# Patient Record
Sex: Female | Born: 1955
Health system: Southern US, Community
[De-identification: ages and names within clinical notes are randomized; demographics above are authoritative.]

## PROBLEM LIST (undated history)

## (undated) DIAGNOSIS — C801 Malignant (primary) neoplasm, unspecified: Secondary | ICD-10-CM

## (undated) HISTORY — PX: ABDOMINAL SURGERY: SHX537

## (undated) HISTORY — PX: TONSILLECTOMY: SUR1361

---

## 2018-01-08 DIAGNOSIS — R300 Dysuria: Secondary | ICD-10-CM | POA: Diagnosis not present

## 2018-01-29 DIAGNOSIS — R599 Enlarged lymph nodes, unspecified: Secondary | ICD-10-CM | POA: Diagnosis not present

## 2018-01-29 DIAGNOSIS — R8289 Other abnormal findings on cytological and histological examination of urine: Secondary | ICD-10-CM | POA: Diagnosis not present

## 2018-01-29 DIAGNOSIS — R3129 Other microscopic hematuria: Secondary | ICD-10-CM | POA: Diagnosis not present

## 2018-01-29 DIAGNOSIS — R590 Localized enlarged lymph nodes: Secondary | ICD-10-CM | POA: Diagnosis not present

## 2018-01-29 DIAGNOSIS — R339 Retention of urine, unspecified: Secondary | ICD-10-CM | POA: Diagnosis not present

## 2018-01-30 DIAGNOSIS — C7951 Secondary malignant neoplasm of bone: Secondary | ICD-10-CM | POA: Diagnosis not present

## 2018-01-30 DIAGNOSIS — R935 Abnormal findings on diagnostic imaging of other abdominal regions, including retroperitoneum: Secondary | ICD-10-CM | POA: Diagnosis not present

## 2018-01-30 DIAGNOSIS — C539 Malignant neoplasm of cervix uteri, unspecified: Secondary | ICD-10-CM | POA: Diagnosis not present

## 2018-02-03 DIAGNOSIS — R935 Abnormal findings on diagnostic imaging of other abdominal regions, including retroperitoneum: Secondary | ICD-10-CM | POA: Diagnosis not present

## 2018-02-03 DIAGNOSIS — R1900 Intra-abdominal and pelvic swelling, mass and lump, unspecified site: Secondary | ICD-10-CM | POA: Diagnosis not present

## 2018-02-03 DIAGNOSIS — C52 Malignant neoplasm of vagina: Secondary | ICD-10-CM | POA: Diagnosis not present

## 2018-02-03 DIAGNOSIS — N898 Other specified noninflammatory disorders of vagina: Secondary | ICD-10-CM | POA: Diagnosis not present

## 2018-02-04 DIAGNOSIS — R1900 Intra-abdominal and pelvic swelling, mass and lump, unspecified site: Secondary | ICD-10-CM | POA: Diagnosis not present

## 2018-02-04 DIAGNOSIS — C679 Malignant neoplasm of bladder, unspecified: Secondary | ICD-10-CM | POA: Diagnosis not present

## 2018-02-10 DIAGNOSIS — R918 Other nonspecific abnormal finding of lung field: Secondary | ICD-10-CM | POA: Diagnosis not present

## 2018-02-10 DIAGNOSIS — N200 Calculus of kidney: Secondary | ICD-10-CM | POA: Diagnosis not present

## 2018-02-10 DIAGNOSIS — C679 Malignant neoplasm of bladder, unspecified: Secondary | ICD-10-CM | POA: Diagnosis not present

## 2018-02-10 DIAGNOSIS — R1900 Intra-abdominal and pelvic swelling, mass and lump, unspecified site: Secondary | ICD-10-CM | POA: Diagnosis not present

## 2018-02-12 DIAGNOSIS — C539 Malignant neoplasm of cervix uteri, unspecified: Secondary | ICD-10-CM | POA: Diagnosis not present

## 2018-02-13 DIAGNOSIS — C539 Malignant neoplasm of cervix uteri, unspecified: Secondary | ICD-10-CM | POA: Diagnosis not present

## 2018-02-14 DIAGNOSIS — C539 Malignant neoplasm of cervix uteri, unspecified: Secondary | ICD-10-CM | POA: Diagnosis not present

## 2018-02-17 DIAGNOSIS — C539 Malignant neoplasm of cervix uteri, unspecified: Secondary | ICD-10-CM | POA: Diagnosis not present

## 2018-02-18 DIAGNOSIS — C539 Malignant neoplasm of cervix uteri, unspecified: Secondary | ICD-10-CM | POA: Diagnosis not present

## 2018-02-19 DIAGNOSIS — C539 Malignant neoplasm of cervix uteri, unspecified: Secondary | ICD-10-CM | POA: Diagnosis not present

## 2018-02-20 DIAGNOSIS — G4701 Insomnia due to medical condition: Secondary | ICD-10-CM | POA: Diagnosis not present

## 2018-02-20 DIAGNOSIS — R1900 Intra-abdominal and pelvic swelling, mass and lump, unspecified site: Secondary | ICD-10-CM | POA: Diagnosis not present

## 2018-02-20 DIAGNOSIS — Z515 Encounter for palliative care: Secondary | ICD-10-CM | POA: Diagnosis not present

## 2018-02-20 DIAGNOSIS — C539 Malignant neoplasm of cervix uteri, unspecified: Secondary | ICD-10-CM | POA: Diagnosis not present

## 2018-02-24 DIAGNOSIS — C539 Malignant neoplasm of cervix uteri, unspecified: Secondary | ICD-10-CM | POA: Diagnosis not present

## 2018-02-25 DIAGNOSIS — C539 Malignant neoplasm of cervix uteri, unspecified: Secondary | ICD-10-CM | POA: Diagnosis not present

## 2018-02-26 DIAGNOSIS — C539 Malignant neoplasm of cervix uteri, unspecified: Secondary | ICD-10-CM | POA: Diagnosis not present

## 2018-02-27 DIAGNOSIS — C539 Malignant neoplasm of cervix uteri, unspecified: Secondary | ICD-10-CM | POA: Diagnosis not present

## 2018-02-28 DIAGNOSIS — C539 Malignant neoplasm of cervix uteri, unspecified: Secondary | ICD-10-CM | POA: Diagnosis not present

## 2018-03-03 DIAGNOSIS — Z5111 Encounter for antineoplastic chemotherapy: Secondary | ICD-10-CM | POA: Diagnosis not present

## 2018-03-03 DIAGNOSIS — Z5112 Encounter for antineoplastic immunotherapy: Secondary | ICD-10-CM | POA: Diagnosis not present

## 2018-03-03 DIAGNOSIS — C538 Malignant neoplasm of overlapping sites of cervix uteri: Secondary | ICD-10-CM | POA: Diagnosis not present

## 2018-03-03 DIAGNOSIS — Z923 Personal history of irradiation: Secondary | ICD-10-CM | POA: Diagnosis not present

## 2018-03-03 DIAGNOSIS — Z87891 Personal history of nicotine dependence: Secondary | ICD-10-CM | POA: Diagnosis not present

## 2018-03-03 DIAGNOSIS — C7951 Secondary malignant neoplasm of bone: Secondary | ICD-10-CM | POA: Diagnosis not present

## 2018-03-03 DIAGNOSIS — Z7189 Other specified counseling: Secondary | ICD-10-CM | POA: Diagnosis not present

## 2018-03-03 DIAGNOSIS — R590 Localized enlarged lymph nodes: Secondary | ICD-10-CM | POA: Diagnosis not present

## 2018-03-03 DIAGNOSIS — K5900 Constipation, unspecified: Secondary | ICD-10-CM | POA: Diagnosis not present

## 2018-03-03 DIAGNOSIS — C539 Malignant neoplasm of cervix uteri, unspecified: Secondary | ICD-10-CM | POA: Diagnosis not present

## 2018-03-03 DIAGNOSIS — K6289 Other specified diseases of anus and rectum: Secondary | ICD-10-CM | POA: Diagnosis not present

## 2018-03-10 ENCOUNTER — Inpatient Hospital Stay (HOSPITAL_BASED_OUTPATIENT_CLINIC_OR_DEPARTMENT_OTHER)
Admission: EM | Admit: 2018-03-10 | Discharge: 2018-03-14 | DRG: 872 | Disposition: A | Discharge status: Home / Self Care | Payer: BLUE CROSS/BLUE SHIELD | Attending: Internal Medicine | Admitting: Internal Medicine

## 2018-03-10 ENCOUNTER — Other Ambulatory Visit: Payer: Self-pay

## 2018-03-10 ENCOUNTER — Encounter (HOSPITAL_BASED_OUTPATIENT_CLINIC_OR_DEPARTMENT_OTHER): Payer: Self-pay | Admitting: *Deleted

## 2018-03-10 ENCOUNTER — Emergency Department (HOSPITAL_BASED_OUTPATIENT_CLINIC_OR_DEPARTMENT_OTHER): Payer: BLUE CROSS/BLUE SHIELD

## 2018-03-10 DIAGNOSIS — R5081 Fever presenting with conditions classified elsewhere: Secondary | ICD-10-CM | POA: Diagnosis present

## 2018-03-10 DIAGNOSIS — Z87440 Personal history of urinary (tract) infections: Secondary | ICD-10-CM | POA: Diagnosis not present

## 2018-03-10 DIAGNOSIS — Z833 Family history of diabetes mellitus: Secondary | ICD-10-CM | POA: Diagnosis not present

## 2018-03-10 DIAGNOSIS — A419 Sepsis, unspecified organism: Secondary | ICD-10-CM | POA: Diagnosis not present

## 2018-03-10 DIAGNOSIS — C539 Malignant neoplasm of cervix uteri, unspecified: Secondary | ICD-10-CM | POA: Diagnosis not present

## 2018-03-10 DIAGNOSIS — D709 Neutropenia, unspecified: Secondary | ICD-10-CM | POA: Diagnosis present

## 2018-03-10 DIAGNOSIS — R6510 Systemic inflammatory response syndrome (SIRS) of non-infectious origin without acute organ dysfunction: Secondary | ICD-10-CM | POA: Insufficient documentation

## 2018-03-10 DIAGNOSIS — R509 Fever, unspecified: Secondary | ICD-10-CM | POA: Diagnosis not present

## 2018-03-10 DIAGNOSIS — T451X5A Adverse effect of antineoplastic and immunosuppressive drugs, initial encounter: Secondary | ICD-10-CM | POA: Diagnosis present

## 2018-03-10 DIAGNOSIS — Z8249 Family history of ischemic heart disease and other diseases of the circulatory system: Secondary | ICD-10-CM | POA: Diagnosis not present

## 2018-03-10 DIAGNOSIS — Z79899 Other long term (current) drug therapy: Secondary | ICD-10-CM | POA: Diagnosis not present

## 2018-03-10 DIAGNOSIS — C7800 Secondary malignant neoplasm of unspecified lung: Secondary | ICD-10-CM | POA: Diagnosis not present

## 2018-03-10 DIAGNOSIS — R000 Tachycardia, unspecified: Secondary | ICD-10-CM | POA: Diagnosis not present

## 2018-03-10 DIAGNOSIS — Z923 Personal history of irradiation: Secondary | ICD-10-CM

## 2018-03-10 DIAGNOSIS — K627 Radiation proctitis: Secondary | ICD-10-CM | POA: Diagnosis present

## 2018-03-10 DIAGNOSIS — K5900 Constipation, unspecified: Secondary | ICD-10-CM | POA: Diagnosis present

## 2018-03-10 DIAGNOSIS — E860 Dehydration: Secondary | ICD-10-CM | POA: Diagnosis present

## 2018-03-10 DIAGNOSIS — C7951 Secondary malignant neoplasm of bone: Secondary | ICD-10-CM | POA: Diagnosis present

## 2018-03-10 DIAGNOSIS — R0602 Shortness of breath: Secondary | ICD-10-CM | POA: Diagnosis not present

## 2018-03-10 DIAGNOSIS — M62838 Other muscle spasm: Secondary | ICD-10-CM | POA: Diagnosis not present

## 2018-03-10 DIAGNOSIS — E876 Hypokalemia: Secondary | ICD-10-CM | POA: Diagnosis present

## 2018-03-10 DIAGNOSIS — Y842 Radiological procedure and radiotherapy as the cause of abnormal reaction of the patient, or of later complication, without mention of misadventure at the time of the procedure: Secondary | ICD-10-CM | POA: Diagnosis present

## 2018-03-10 DIAGNOSIS — D701 Agranulocytosis secondary to cancer chemotherapy: Secondary | ICD-10-CM | POA: Diagnosis present

## 2018-03-10 DIAGNOSIS — Z9221 Personal history of antineoplastic chemotherapy: Secondary | ICD-10-CM

## 2018-03-10 DIAGNOSIS — K649 Unspecified hemorrhoids: Secondary | ICD-10-CM | POA: Diagnosis present

## 2018-03-10 DIAGNOSIS — G629 Polyneuropathy, unspecified: Secondary | ICD-10-CM | POA: Diagnosis present

## 2018-03-10 HISTORY — DX: Malignant (primary) neoplasm, unspecified: C80.1

## 2018-03-10 LAB — COMPREHENSIVE METABOLIC PANEL
ALT: 28 U/L (ref 0–44)
AST: 21 U/L (ref 15–41)
Albumin: 3.4 g/dL — ABNORMAL LOW (ref 3.5–5.0)
Alkaline Phosphatase: 120 U/L (ref 38–126)
Anion gap: 13 (ref 5–15)
BUN: 11 mg/dL (ref 6–20)
CHLORIDE: 96 mmol/L — AB (ref 98–111)
CO2: 24 mmol/L (ref 22–32)
Calcium: 9.2 mg/dL (ref 8.9–10.3)
Creatinine, Ser: 0.79 mg/dL (ref 0.44–1.00)
GFR calc Af Amer: >60 mL/min (ref >60)
GFR calc non Af Amer: >60 mL/min (ref >60)
Glucose, Bld: 123 mg/dL — ABNORMAL HIGH (ref 70–99)
POTASSIUM: 3.5 mmol/L (ref 3.5–5.1)
SODIUM: 133 mmol/L — AB (ref 135–145)
Total Bilirubin: 0.8 mg/dL (ref 0.3–1.2)
Total Protein: 7.4 g/dL (ref 6.5–8.1)

## 2018-03-10 LAB — CBC WITH DIFFERENTIAL/PLATELET
Abs Immature Granulocytes: 0.03 10*3/uL (ref 0.00–0.07)
Basophils Absolute: 0 10*3/uL (ref 0.0–0.1)
Basophils Relative: 1 %
Eosinophils Absolute: 0 10*3/uL (ref 0.0–0.5)
Eosinophils Relative: 3 %
HCT: 34.9 % — ABNORMAL LOW (ref 36.0–46.0)
Hemoglobin: 11.3 g/dL — ABNORMAL LOW (ref 12.0–15.0)
IMMATURE GRANULOCYTES: 3 %
LYMPHS ABS: 0.1 10*3/uL — AB (ref 0.7–4.0)
Lymphocytes Relative: 10 %
MCH: 28.4 pg (ref 26.0–34.0)
MCHC: 32.4 g/dL (ref 30.0–36.0)
MCV: 87.7 fL (ref 80.0–100.0)
Monocytes Absolute: 0.3 10*3/uL (ref 0.1–1.0)
Monocytes Relative: 28 %
Neutro Abs: 0.5 10*3/uL — ABNORMAL LOW (ref 1.7–7.7)
Neutrophils Relative %: 55 %
Platelets: 243 10*3/uL (ref 150–400)
RBC: 3.98 MIL/uL (ref 3.87–5.11)
RDW: 13 % (ref 11.5–15.5)
WBC: 0.9 10*3/uL — CL (ref 4.0–10.5)
nRBC: 0 % (ref 0.0–0.2)

## 2018-03-10 LAB — CBC
HCT: 32.1 % — ABNORMAL LOW (ref 36.0–46.0)
Hemoglobin: 9.6 g/dL — ABNORMAL LOW (ref 12.0–15.0)
MCH: 28.6 pg (ref 26.0–34.0)
MCHC: 29.9 g/dL — AB (ref 30.0–36.0)
MCV: 95.5 fL (ref 80.0–100.0)
Platelets: 192 10*3/uL (ref 150–400)
RBC: 3.36 MIL/uL — ABNORMAL LOW (ref 3.87–5.11)
RDW: 13.2 % (ref 11.5–15.5)
WBC: 0.9 10*3/uL — CL (ref 4.0–10.5)
nRBC: 0 % (ref 0.0–0.2)

## 2018-03-10 LAB — LACTIC ACID, PLASMA: Lactic Acid, Venous: 2.3 mmol/L (ref 0.5–1.9)

## 2018-03-10 LAB — CREATININE, SERUM
Creatinine, Ser: 0.69 mg/dL (ref 0.44–1.00)
GFR calc Af Amer: >60 mL/min (ref >60)
GFR calc non Af Amer: >60 mL/min (ref >60)

## 2018-03-10 LAB — LIPASE, BLOOD: Lipase: 17 U/L (ref 11–51)

## 2018-03-10 LAB — INFLUENZA PANEL BY PCR (TYPE A & B)
INFLAPCR: NEGATIVE
Influenza B By PCR: NEGATIVE

## 2018-03-10 MED ORDER — ENOXAPARIN SODIUM 40 MG/0.4ML ~~LOC~~ SOLN
40.0000 mg | SUBCUTANEOUS | Status: DC
  Filled 2018-03-10 (×2): qty 0.4

## 2018-03-10 MED ORDER — OXYBUTYNIN CHLORIDE ER 5 MG PO TB24
5.0000 mg | ORAL_TABLET | Freq: Every day | ORAL | Status: DC
  Filled 2018-03-10 (×3): qty 1

## 2018-03-10 MED ORDER — METRONIDAZOLE IN NACL 5-0.79 MG/ML-% IV SOLN
500.0000 mg | Freq: Once | INTRAVENOUS | Status: AC
  Administered 2018-03-10: 500 mg via INTRAVENOUS
  Filled 2018-03-10: qty 100

## 2018-03-10 MED ORDER — ACETAMINOPHEN 650 MG RE SUPP: 650.0000 mg | Freq: Four times a day (QID) | RECTAL | Status: DC | PRN

## 2018-03-10 MED ORDER — ONDANSETRON HCL 4 MG PO TABS: 4.0000 mg | ORAL_TABLET | Freq: Four times a day (QID) | ORAL | Status: DC | PRN

## 2018-03-10 MED ORDER — SODIUM CHLORIDE 0.9 % IV SOLN
2.0000 g | Freq: Once | INTRAVENOUS | Status: DC
  Filled 2018-03-10: qty 2

## 2018-03-10 MED ORDER — VANCOMYCIN HCL IN DEXTROSE 750-5 MG/150ML-% IV SOLN
750.0000 mg | Freq: Two times a day (BID) | INTRAVENOUS | Status: DC
  Administered 2018-03-11 – 2018-03-14 (×7): 750 mg via INTRAVENOUS
  Filled 2018-03-10 (×9): qty 150

## 2018-03-10 MED ORDER — GABAPENTIN 300 MG PO CAPS
300.0000 mg | ORAL_CAPSULE | Freq: Three times a day (TID) | ORAL | Status: DC
  Administered 2018-03-10: 300 mg via ORAL
  Filled 2018-03-10: qty 1

## 2018-03-10 MED ORDER — CEFEPIME HCL 2 G IJ SOLR
INTRAMUSCULAR | Status: AC
  Filled 2018-03-10: qty 2

## 2018-03-10 MED ORDER — TIZANIDINE HCL 4 MG PO TABS
2.0000 mg | ORAL_TABLET | Freq: Three times a day (TID) | ORAL | Status: DC | PRN
  Administered 2018-03-11: 2 mg via ORAL
  Filled 2018-03-10: qty 1

## 2018-03-10 MED ORDER — VANCOMYCIN HCL 1000 MG IV SOLR
INTRAVENOUS | Status: AC
  Filled 2018-03-10: qty 1000

## 2018-03-10 MED ORDER — ONDANSETRON HCL 4 MG/2ML IJ SOLN
4.0000 mg | Freq: Four times a day (QID) | INTRAMUSCULAR | Status: DC | PRN
  Administered 2018-03-11: 4 mg via INTRAVENOUS
  Filled 2018-03-10: qty 2

## 2018-03-10 MED ORDER — ENSURE ENLIVE PO LIQD
237.0000 mL | Freq: Two times a day (BID) | ORAL | Status: DC
  Administered 2018-03-11: 237 mL via ORAL

## 2018-03-10 MED ORDER — VANCOMYCIN HCL IN DEXTROSE 1-5 GM/200ML-% IV SOLN: 1000.0000 mg | Freq: Once | INTRAVENOUS | Status: DC

## 2018-03-10 MED ORDER — SODIUM CHLORIDE 0.9 % IV BOLUS
1000.0000 mL | Freq: Once | INTRAVENOUS | Status: AC
  Administered 2018-03-10: 1000 mL via INTRAVENOUS

## 2018-03-10 MED ORDER — SODIUM CHLORIDE 0.9 % IV SOLN
2.0000 g | Freq: Three times a day (TID) | INTRAVENOUS | Status: DC
  Administered 2018-03-10 – 2018-03-14 (×11): 2 g via INTRAVENOUS
  Filled 2018-03-10 (×12): qty 2

## 2018-03-10 MED ORDER — VANCOMYCIN HCL 500 MG IV SOLR
INTRAVENOUS | Status: AC
  Filled 2018-03-10: qty 500

## 2018-03-10 MED ORDER — ACETAMINOPHEN 325 MG PO TABS
650.0000 mg | ORAL_TABLET | Freq: Four times a day (QID) | ORAL | Status: DC | PRN
  Administered 2018-03-10 – 2018-03-14 (×6): 650 mg via ORAL
  Filled 2018-03-10 (×6): qty 2

## 2018-03-10 MED ORDER — HYDROCORTISONE ACETATE 25 MG RE SUPP
25.0000 mg | Freq: Two times a day (BID) | RECTAL | Status: DC
  Filled 2018-03-10: qty 1

## 2018-03-10 MED ORDER — HYDROCORTISONE 2.5 % RE CREA
TOPICAL_CREAM | Freq: Four times a day (QID) | RECTAL | Status: DC | PRN
  Administered 2018-03-10 – 2018-03-11 (×4): via RECTAL
  Filled 2018-03-10: qty 28.35

## 2018-03-10 MED ORDER — MORPHINE SULFATE (PF) 4 MG/ML IV SOLN
4.0000 mg | Freq: Once | INTRAVENOUS | Status: AC
  Administered 2018-03-10: 4 mg via INTRAVENOUS
  Filled 2018-03-10: qty 1

## 2018-03-10 MED ORDER — METRONIDAZOLE IN NACL 5-0.79 MG/ML-% IV SOLN
500.0000 mg | Freq: Three times a day (TID) | INTRAVENOUS | Status: DC
  Administered 2018-03-11: 500 mg via INTRAVENOUS
  Filled 2018-03-10: qty 100

## 2018-03-10 MED ORDER — ACETAMINOPHEN 500 MG PO TABS
1000.0000 mg | ORAL_TABLET | Freq: Once | ORAL | Status: AC
  Administered 2018-03-10: 1000 mg via ORAL
  Filled 2018-03-10: qty 2

## 2018-03-10 MED ORDER — SODIUM CHLORIDE 0.9 % IV SOLN
2.0000 g | Freq: Two times a day (BID) | INTRAVENOUS | Status: DC
  Administered 2018-03-10: 2 g via INTRAVENOUS
  Filled 2018-03-10 (×2): qty 2

## 2018-03-10 MED ORDER — GABAPENTIN 300 MG PO CAPS
600.0000 mg | ORAL_CAPSULE | Freq: Three times a day (TID) | ORAL | Status: DC
  Administered 2018-03-10: 300 mg via ORAL
  Administered 2018-03-11 – 2018-03-14 (×9): 600 mg via ORAL
  Filled 2018-03-10 (×10): qty 2

## 2018-03-10 MED ORDER — VANCOMYCIN HCL 10 G IV SOLR
1500.0000 mg | Freq: Once | INTRAVENOUS | Status: AC
  Administered 2018-03-10: 1500 mg via INTRAVENOUS
  Filled 2018-03-10: qty 1500

## 2018-03-10 MED ORDER — MORPHINE SULFATE (PF) 2 MG/ML IV SOLN
2.0000 mg | Freq: Once | INTRAVENOUS | Status: AC
  Administered 2018-03-10: 2 mg via INTRAVENOUS
  Filled 2018-03-10: qty 1

## 2018-03-10 MED ORDER — IBUPROFEN 200 MG PO TABS
400.0000 mg | ORAL_TABLET | Freq: Three times a day (TID) | ORAL | Status: DC | PRN
  Administered 2018-03-10 – 2018-03-14 (×9): 400 mg via ORAL
  Filled 2018-03-10 (×9): qty 2

## 2018-03-10 MED ORDER — SODIUM CHLORIDE 0.9 % IV BOLUS
500.0000 mL | Freq: Once | INTRAVENOUS | Status: AC
  Administered 2018-03-10: 500 mL via INTRAVENOUS

## 2018-03-10 MED ORDER — SODIUM CHLORIDE 0.9 % IV SOLN
INTRAVENOUS | Status: AC
  Administered 2018-03-10 – 2018-03-11 (×3): via INTRAVENOUS

## 2018-03-10 NOTE — ED Notes (Signed)
Pt has had no diarrhea since arriving in ed

## 2018-03-10 NOTE — ED Notes (Signed)
C/o diarrhea, weakness and sob  Worse today,  Just finished radiation and started chemo

## 2018-03-10 NOTE — ED Provider Notes (Signed)
Emergency Department Provider Note   I have reviewed the triage vital signs and the nursing notes.   HISTORY  Chief Complaint Shortness of Breath   HPI Laura Woodward is a 63 y.o. female with stage IV cervical cancer currently on chemotherapy to the emergency department with fever, shortness of breath, chronic diarrhea.  She feels generally weak.  Her oral intake is decreasing.  She has had the diarrhea symptoms for several weeks which began shortly prior to her radiation treatments.  She states that that has not worsened significantly.  She was recently treated for a urinary tract infection with antibiotics and finished those today but is unsure of the exact medication.  She denies any dysuria, hesitancy, urgency.  She is not experiencing chest pain.  Her shortness of breath is mainly with exertion.  No blood in the bowel movements.  She denies any significant abdominal pain.  He is primarily followed at [facility]   Chemo:  Carboplatin AUC 5 Paclitaxel 175 mg Bevacizumab 15mg /kg q 21 days  Last treatment was 03/03/18  Past Medical History:  Diagnosis Date  . Cancer [facility])     Patient Active Problem List   Diagnosis Date Noted  . Neutropenic fever ([place]) 03/10/2018    Past Surgical History:  Procedure Laterality Date  . TONSILLECTOMY      Allergies Sulfa antibiotics and Ceftriaxone  No family history on file.  Social History Social History   Tobacco Use  . Smoking status: Never Smoker  . Smokeless tobacco: Never Used  Substance Use Topics  . Alcohol use: Not Currently  . Drug use: Never    Review of Systems  Constitutional: Positive fever/chills and generalized weakness.  Eyes: No visual changes. ENT: No sore throat. Cardiovascular: Denies chest pain. Respiratory: Positive shortness of breath and cough.  Gastrointestinal: No abdominal pain.  No nausea, no vomiting. Positive chronic diarrhea.  No constipation. Genitourinary: Negative for  dysuria. Musculoskeletal: Negative for back pain. Skin: Negative for rash. Neurological: Negative for headaches, focal weakness or numbness.  10-point ROS otherwise negative.  ____________________________________________   PHYSICAL EXAM:  VITAL SIGNS: ED Triage Vitals  Enc Vitals Group     BP 03/10/18 1400 123/75     Pulse Rate 03/10/18 1400 74     Resp 03/10/18 1400 (!) 25     Temp 03/10/18 1229 100 F (37.8 C)     Temp Source 03/10/18 1229 Oral     SpO2 03/10/18 1220 97 %     Weight 03/10/18 1204 136 lb (61.7 kg)     Height 03/10/18 1204 5' 6.5" (1.689 m)     Pain Score 03/10/18 1203 8   Constitutional: Alert and oriented. Well appearing and in no acute distress. Eyes: Conjunctivae are normal.  Head: Atraumatic. Nose: No congestion/rhinnorhea. Mouth/Throat: Mucous membranes are moist.  Neck: No stridor.  Cardiovascular: Normal rate, regular rhythm. Good peripheral circulation. Grossly normal heart sounds.   Respiratory: Normal respiratory effort.  No retractions. Lungs CTAB. Gastrointestinal: Soft and nontender. No distention.  Musculoskeletal: No lower extremity tenderness nor edema. No gross deformities of extremities. Neurologic:  Normal speech and language. No gross focal neurologic deficits are appreciated.  Skin:  Skin is warm, dry and intact. No rash noted.  ____________________________________________   LABS (all labs ordered are listed, but only abnormal results are displayed)  Labs Reviewed  LACTIC ACID, PLASMA - Abnormal; Notable for the following components:      Result Value   Lactic Acid, Venous 2.3 (*)  All other components within normal limits  COMPREHENSIVE METABOLIC PANEL - Abnormal; Notable for the following components:   Sodium 133 (*)    Chloride 96 (*)    Glucose, Bld 123 (*)    Albumin 3.4 (*)    All other components within normal limits  CBC WITH DIFFERENTIAL/PLATELET - Abnormal; Notable for the following components:   WBC 0.9 (*)     Hemoglobin 11.3 (*)    HCT 34.9 (*)    Neutro Abs 0.5 (*)    Lymphs Abs 0.1 (*)    All other components within normal limits  CULTURE, BLOOD (ROUTINE X 2)  CULTURE, BLOOD (ROUTINE X 2)  URINE CULTURE  C DIFFICILE QUICK SCREEN W PCR REFLEX  LIPASE, BLOOD  LACTIC ACID, PLASMA  URINALYSIS, ROUTINE W REFLEX MICROSCOPIC  INFLUENZA PANEL BY PCR (TYPE A & B)   ____________________________________________  EKG   EKG Interpretation  Date/Time:  Monday March 10 2018 12:18:25 EDT Ventricular Rate:  109 PR Interval:    QRS Duration: 85 QT Interval:  309 QTC Calculation: 416 R Axis:   109 Text Interpretation:  Sinus tachycardia Right axis deviation Abnormal R-wave progression, early transition Abnormal lateral Q waves No STEMI.  Confirmed by Nanda Quinton [phone]) on 03/10/2018 3:12:04 PM       ____________________________________________  RADIOLOGY  Dg Chest 2 View  Result Date: 03/10/2018 CLINICAL DATA:  Shortness of breath. EXAM: CHEST - 2 VIEW COMPARISON:  None. FINDINGS: The heart size and mediastinal contours are within normal limits. Both lungs are clear. No pneumothorax or pleural effusion is noted. The visualized skeletal structures are unremarkable. IMPRESSION: No active cardiopulmonary disease. Electronically Signed   By: Marijo Conception, M.D.   On: 03/10/2018 13:42    ____________________________________________   PROCEDURES  Procedure(s) performed:   Procedures  CRITICAL CARE Performed by: Margette Fast Total critical care time: 35 minutes Critical care time was exclusive of separately billable procedures and treating other patients. Critical care was necessary to treat or prevent imminent or life-threatening deterioration. Critical care was time spent personally by me on the following activities: development of treatment plan with patient and/or surrogate as well as nursing, discussions with consultants, evaluation of patient's response to treatment,  examination of patient, obtaining history from patient or surrogate, ordering and performing treatments and interventions, ordering and review of laboratory studies, ordering and review of radiographic studies, pulse oximetry and re-evaluation of patient's condition.  Nanda Quinton, MD Emergency Medicine  ____________________________________________   INITIAL IMPRESSION / ASSESSMENT AND PLAN / ED COURSE  Pertinent labs & imaging results that were available during my care of the patient were reviewed by me and considered in my medical decision making (see chart for details).  Patient presents emergency department with fever, weakness, shortness of breath.  Her diarrhea symptoms have been present since late February and began with her palliative radiation treatments.  She is followed at [facility] for her cancer treatment and is on chemotherapy.  She is febrile here with mild elevated lactate.  Patient has leukopenia on CBC and I have begun broad-spectrum antibiotics treating for neutropenic fever.  Patient is overall well-appearing.  She is able to provide a full history protecting her airway well.  I have considered PE but feel this is less likely without chest pain and in the setting of neutropenia.  Patient is satting well on 2 L nasal cannula.  Not requiring BiPAP or other airway support. Not high rick for COVID-19. CXR reviewed with no infiltrate.  Despite the patient being followed primarily at [facility] she prefers to be admitted to a local hospital.  I plan to reach out to the hospitalist regarding monitoring and further treatment locally.   Discussed patient's case with Hospitalist, Dr. Cruzita Lederer to request admission. Patient and family (if present) updated with plan. Care transferred to Hospitalist service.  I reviewed all nursing notes, vitals, pertinent old records, EKGs, labs, imaging (as available). ____________________________________________  FINAL CLINICAL IMPRESSION(S) / ED  DIAGNOSES  Final diagnoses:  Neutropenic fever ([place])     MEDICATIONS GIVEN DURING THIS VISIT:  Medications  metroNIDAZOLE (FLAGYL) IVPB 500 mg (500 mg Intravenous New Bag/Given 03/10/18 1438)  ceFEPIme (MAXIPIME) 2 g in sodium chloride 0.9 % 100 mL IVPB ( Intravenous Stopped 03/10/18 1442)  vancomycin (VANCOCIN) 1,500 mg in sodium chloride 0.9 % 500 mL IVPB (1,500 mg Intravenous New Bag/Given 03/10/18 1524)  ceFEPIme (MAXIPIME) 2 g injection (has no administration in time range)  vancomycin (VANCOCIN) 1000 MG powder (has no administration in time range)  vancomycin (VANCOCIN) 500 MG powder (has no administration in time range)  vancomycin (VANCOCIN) IVPB 750 mg/150 ml premix (has no administration in time range)  sodium chloride 0.9 % bolus 1,000 mL (0 mLs Intravenous Stopped 03/10/18 1436)  acetaminophen (TYLENOL) tablet 1,000 mg (1,000 mg Oral Given 03/10/18 1319)  morphine 4 MG/ML injection 4 mg (4 mg Intravenous Given 03/10/18 1423)    Note:  This document was prepared using Dragon voice recognition software and may include unintentional dictation errors.  Nanda Quinton, MD Emergency Medicine    Dhani Imel, [facility], MD 03/10/18 [phone]

## 2018-03-10 NOTE — ED Notes (Signed)
Report called to rn on 1616

## 2018-03-10 NOTE — H&P (Signed)
TRH H&P   Laura Woodward Demographics:    Laura Woodward, is a 63 y.o. female  MRN: [id number]   DOB - 09/26/1955  Admit Date - 03/10/2018  Outpatient Primary MD for the Laura Woodward is Laura Woodward, Laura Woodward  Referring MD: ED admits at [facility]  Outpatient Specialists: Brock Bad (GYN oncology at [facility])  Laura Woodward coming from: Home  Chief Complaint  Laura Woodward presents with  . Shortness of Breath  Subjective fever with chills, diarrhea   HPI:    Laura Woodward  is a 63 y.o. 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  Laura Woodward had Laura significant past medical history when she was seen by urology for several months of hematuria with incomplete bladder emptying about 6 weeks back.  In September 2019 she had a normal cystoscopy and was being treated for an overactive bladder.  She had a CT urogram on 1/29 2020 which was concerning for advanced malignancy of GYN origin. She had pelvic MRI showing advanced cervical malignancy. She was started on palliative radiation about 3 weeks back and completed radiation to left ischial metastasis and inguinal lymphadenopathy on 2/28.   Since 2/26 he was having frequent diarrhea (episodes of watery and loose bowel movement lasting almost an hour with several hours of abstinence).  On 3/2  She was started on chemotherapy on carboplatin AUC 5 / paclitaxel 155m/m2 / bevacizumab 187mkg q 21 days.   Laura Woodward reported that her anus and rectum has been feeling raw and painful due to frequent bowel movement and frequent cleaning of the area.  She also noticed occasional blood mixed stool which she thinks is due to hemorrhoids. She reports she has very frequent bowel movements with the radiation.  She has taken Imodium intermittently which caused her to have some constipation so she stopped  taking them. Laura Woodward reports having UTI symptoms and was treated for it.  (completed antibiotic today).  Laura Woodward reports that for past 2-3 days she has been increasingly weak with subjective fevers and chills (but temperature was 99.8 F when recorded at home yesterday but was sweaty in the evening).  Reports some dyspnea on exertion.  She also reports poor appetite for past 2 days and some nausea but Laura vomiting.  Denies any headache, blurred vision, dizziness, difficulty swallowing or pain on swallowing, chest pain, palpitations, abdominal pain.  Reports dysuria.  Also informs that she started having neuropathy in her arms and legs for which she was started on gabapentin. She denies any sick contacts or recent travel.  [facility] of 100 F, heart rate of 100, tachypneic to 20s, normal blood pressure, stable O2 sat on room air.  Blood work showed WBC of 0.9k, hemoglobin of 11.3, platelets of 243, sodium of 133, chloride of 96, normal BUN and  creatinine, glucose of 123 and lactic acid of 2.3.  LFTs were normal.  Chest x-ray without any infiltrate or cardiopulmonary disease.  Flu PCR was negative.  Initial lactic acid was 2.3 and based on her vitals, blood work and lactic acid elevation met criteria for sepsis.  UA, urine culture, blood culture and stool for C. difficile ordered.  Laura Woodward given 1.5 L normal saline bolus and ordered for empiric vancomycin, cefepime and Flagyl.  Hospitalist consulted for admission to medical floor (Laura Woodward insisted on being treated locally).     Review of systems:    In addition to the HPI above, (positive symptoms in bold). Fever and chills Laura Headache, Laura changes with Vision or hearing, Laura problems swallowing food or Liquids, Laura Chest pain, Cough,  Shortness of Breath, Laura Abdominal pain, nausea +, Laura vomiting, diarrhea + +  Blood in stool Dysuria + Laura new skin rashes or bruises, Laura new joints pains-aches,  Generalized  weakness, peripheral neuropathy + Laura recent weight gain or loss, Laura polyuria, polydypsia or polyphagia, Laura significant Mental Stressors.    With Past History of the following :    Past Medical History:  Diagnosis Date  . Cancer [facility])       Past Surgical History:  Procedure Laterality Date  . TONSILLECTOMY        Social History:     Social History   Tobacco Use  . Smoking status: Never Smoker  . Smokeless tobacco: Never Used  Substance Use Topics  . Alcohol use: Not Currently     Lives -home  Mobility -independent   Family History :   Both mother and father had coronary artery disease and diabetes.   Home Medications:   Prior to Admission medications   Medication Sig Start Date End Date Taking? Authorizing Provider  dexamethasone (DECADRON) 4 MG tablet Take 2 tablets (8 mg) by mouth daily with breakfast for 2 days after chemotherapy each cycle. 02/20/18  Yes [provider]  gabapentin (NEURONTIN) 300 MG capsule Take by mouth. 03/05/18 03/05/19 Yes [provider]  lidocaine (XYLOCAINE) 2 % jelly Apply 2-3x/day with CIC insertion as needed 02/05/18  Yes [provider]  mirabegron ER (MYRBETRIQ) 50 MG TB24 tablet Take by mouth. 10/23/17  Yes [provider]  ondansetron (ZOFRAN) 8 MG tablet Take 1 tablet (8 mg) by mouth every 8 hours as needed for nausea and/or vomiting. 03/04/18  Yes [provider]  oxybutynin (DITROPAN-XL) 5 MG 24 hr tablet Take by mouth. 12/30/17  Yes [provider]  tiZANidine (ZANAFLEX) 2 MG tablet Take by mouth. 03/06/18 03/06/19 Yes [provider]  traZODone (DESYREL) 50 MG tablet Take by mouth. 02/20/18 02/20/19 Yes [provider]  trospium (SANCTURA) 20 MG tablet Take by mouth. 01/08/18  Yes [provider]  acetaminophen (TYLENOL) 500 MG tablet Take by mouth.    [provider]  Acetaminophen-Caff-Pyrilamine 650-35-46 MG TABS Take by mouth.    [provider]  ibuprofen (ADVIL,MOTRIN) 200 MG tablet Take by mouth.    [provider]  prochlorperazine (COMPAZINE) 10 MG tablet Take by mouth.    [provider]     Allergies:     Allergies  Allergen Reactions  . Sulfa Antibiotics     Leg swelling  . Ceftriaxone Nausea And Vomiting     Physical Exam:   Vitals  Blood pressure 104/65, pulse 88, temperature 99.6 F (37.6 C), temperature source Oral, resp. rate 18, height '5\' 6"'$  (1.676 m),  weight 68.1 kg, SpO2 100 %.   General: Middle-aged female lying in bed appears fatigued, in Laura acute distress HEENT: Pupils reactive bilateral, EOMI, pallor present, Laura icterus, Laura oral thrush, dry oral mucosa, Laura cervical lymphadenopathy Chest: Clear to auscultation bilaterally, Laura added sound CVS: Normal S1-S2, Laura murmurs rub or gallop GI: Soft, nondistended, nontender, bowel sounds present, as Woodward nurse her anal area is dry and excoriated Musculoskeletal: Warm, Laura edema CNS: Alert and oriented, nonfocal   Data Review:    CBC Recent Labs  Lab 03/10/18 1332  WBC 0.9*  HGB 11.3*  HCT 34.9*  PLT 243  MCV 87.7  MCH 28.4  MCHC 32.4  RDW 13.0  LYMPHSABS 0.1*  MONOABS 0.3  EOSABS 0.0  BASOSABS 0.0   ------------------------------------------------------------------------------------------------------------------  Chemistries  Recent Labs  Lab 03/10/18 1332  NA 133*  K 3.5  CL 96*  CO2 24  GLUCOSE 123*  BUN 11  CREATININE 0.79  CALCIUM 9.2  AST 21  ALT 28  ALKPHOS 120  BILITOT 0.8   ------------------------------------------------------------------------------------------------------------------ estimated creatinine clearance is 70 mL/min (by C-G formula based on SCr of 0.79 mg/dL). ------------------------------------------------------------------------------------------------------------------ Laura results for input(s): TSH, T4TOTAL, T3FREE, THYROIDAB in the last 72 hours.  Invalid input(s):  FREET3  Coagulation profile Laura results for input(s): INR, PROTIME in the last 168 hours. ------------------------------------------------------------------------------------------------------------------- Laura results for input(s): DDIMER in the last 72 hours. -------------------------------------------------------------------------------------------------------------------  Cardiac Enzymes Laura results for input(s): CKMB, TROPONINI, MYOGLOBIN in the last 168 hours.  Invalid input(s): CK ------------------------------------------------------------------------------------------------------------------ Laura results found for: BNP   ---------------------------------------------------------------------------------------------------------------  Urinalysis Laura results found for: COLORURINE, APPEARANCEUR, LABSPEC, PHURINE, GLUCOSEU, HGBUR, BILIRUBINUR, KETONESUR, PROTEINUR, UROBILINOGEN, NITRITE, LEUKOCYTESUR  ----------------------------------------------------------------------------------------------------------------   Imaging Results:    Dg Chest 2 View  Result Date: 03/10/2018 CLINICAL DATA:  Shortness of breath. EXAM: CHEST - 2 VIEW COMPARISON:  None. FINDINGS: The heart size and mediastinal contours are within normal limits. Both lungs are clear. Laura pneumothorax or pleural effusion is noted. The visualized skeletal structures are unremarkable. IMPRESSION: Laura active cardiopulmonary disease. Electronically Signed   By: Marijo Conception, M.D.   On: 03/10/2018 13:42    My personal review of EKG: Sinus tachycardia at 109, Laura ST-T changes.   Assessment & Plan:    Principal Problem:  Sepsis ([place])   Neutropenic fever ([place]) Likely associated with recent chemotherapy.  A1c of 450. Continue neutropenic precautions.  Continue empiric IV vancomycin and cefepime.  Added empiric Flagyl with concern to rule out stool for C. difficile (recent chemotherapy with immunosuppression, antibiotic use and  ongoing diarrhea). Maintain enteric precaution as well.  Flu PCR negative. Aggressive IV hydration with normal saline at 125 cc/h.  Repeat lactic acid. Follow blood and urine culture ordered on admission.    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  Completed radiation to the left ischial met and inguinal adenopathy about 2 weeks back and received first cycle of chemotherapy 1 week ago.  Follows with G[facility] oncology at [facility].  Will communicate with her oncologist as needed.  Peripheral neuropathy Resume her gabapentin.  Resume Zanaflex for muscle spasms and Advil for as needed pain.  Anal irritation and pain Ordered Anusol suppository.  DVT Prophylaxis: Subcu Lovenox  AM Labs Ordered, also please review Full Orders  Family Communication: Admission, patients condition and plan of care including tests being ordered have been discussed with  the Laura Woodward at bedside.  Code Status full code  Likely DC to home once stable  Condition: Fair  Consults called: None  Admission status: Inpatient Laura Woodward presenting with sepsis along with neutropenic fever with generalized deconditioning and dehydration.  Laura Woodward will need to be treated with empiric IV antibiotic, aggressive IV hydration with close monitoring of her CBC and electrolytes.  Laura Woodward will need to be closely monitored for at least 2 midnight in an inpatient setting.  Time spent in minutes : 70   Crickett Abbett M.D on 03/10/2018 at 5:52 PM  Between 7am to 7pm - Pager - [phone]. After 7pm go to [email or web address] - password [facility]  [facility] - Office  [phone]

## 2018-03-10 NOTE — ED Triage Notes (Signed)
SOB, weakness and diarrhea. She has a hx of cancer with lung involvement. She is on chemotherapy.

## 2018-03-10 NOTE — Progress Notes (Signed)
PHARMACY - CEFEPIME/VANCOMYCIN (brief note)  See full note from earlier today by Bertis Ruddy, PharmD.  Patient transferred from [facility] and now with a diagnosis of Febrile Neutropenia.  Previous order of Cefepime 2gm IV q12h has been changed to Cefepime 2gm IV q8h.  Continue Vancomycin as previously ordered.  Leone Haven, PharmD 03/10/18 @ 17:46

## 2018-03-10 NOTE — Progress Notes (Signed)
Pharmacy Antibiotic Note  Laura Woodward is a 63 y.o. female admitted on 03/10/2018 with sepsis.  Pharmacy has been consulted for vancomycin and cefepime dosing.  Flagyl per MD.    Plan: Vancomycin 1500mg  IV x 1, then 750mg  IV every 12h (calc AUC 524, SCr 0.79) Cefepime 2g IV every 12 hours Monitor renal function, Cx and clinical progression to narrow Vancomycin levels at steady state  Height: 5' 6.5" (168.9 cm) Weight: 136 lb (61.7 kg) IBW/kg (Calculated) : 60.45  Temp (24hrs), Avg:100 F (37.8 C), Min:100 F (37.8 C), Max:100 F (37.8 C)  Recent Labs  Lab 03/10/18 1331 03/10/18 1332  WBC  --  PENDING  CREATININE  --  0.79  LATICACIDVEN 2.3*  --     Estimated Creatinine Clearance: 71.4 mL/min (by C-G formula based on SCr of 0.79 mg/dL).    Allergies  Allergen Reactions  . Ceftriaxone Nausea And Vomiting  . Sulfa Antibiotics     Leg swelling    Antimicrobials this admission: Vanc 3/9>> Cefepime 3/9>> Flagyl 3/9>>  Dose adjustments this admission: n/a  Microbiology results: 3/9 BCx: sent 3/9 UCx: sent  Bertis Ruddy, PharmD Clinical Pharmacist Please check AMION for all [place] numbers 03/10/2018 2:42 PM

## 2018-03-10 NOTE — ED Notes (Signed)
Precautions place on door, bedside commode in room

## 2018-03-11 LAB — URINALYSIS, ROUTINE W REFLEX MICROSCOPIC
Bilirubin Urine: NEGATIVE
Glucose, UA: NEGATIVE mg/dL
Ketones, ur: NEGATIVE mg/dL
Nitrite: NEGATIVE
Protein, ur: 30 mg/dL — AB
SPECIFIC GRAVITY, URINE: 1.009 (ref 1.005–1.030)
WBC, UA: >50 WBC/hpf — ABNORMAL HIGH (ref 0–5)
pH: 7 (ref 5.0–8.0)

## 2018-03-11 LAB — CBC WITH DIFFERENTIAL/PLATELET
Abs Immature Granulocytes: 0.03 10*3/uL (ref 0.00–0.07)
BASOS ABS: 0 10*3/uL (ref 0.0–0.1)
Basophils Relative: 1 %
EOS PCT: 5 %
Eosinophils Absolute: 0.1 10*3/uL (ref 0.0–0.5)
HCT: 27.2 % — ABNORMAL LOW (ref 36.0–46.0)
Hemoglobin: 8.7 g/dL — ABNORMAL LOW (ref 12.0–15.0)
Immature Granulocytes: 2 %
Lymphocytes Relative: 8 %
Lymphs Abs: 0.1 10*3/uL — ABNORMAL LOW (ref 0.7–4.0)
MCH: 29.1 pg (ref 26.0–34.0)
MCHC: 32 g/dL (ref 30.0–36.0)
MCV: 91 fL (ref 80.0–100.0)
Monocytes Absolute: 0.7 10*3/uL (ref 0.1–1.0)
Monocytes Relative: 47 %
Neutro Abs: 0.5 10*3/uL — ABNORMAL LOW (ref 1.7–7.7)
Neutrophils Relative %: 37 %
Platelets: 191 10*3/uL (ref 150–400)
RBC: 2.99 MIL/uL — ABNORMAL LOW (ref 3.87–5.11)
RDW: 13.2 % (ref 11.5–15.5)
WBC: 1.5 10*3/uL — ABNORMAL LOW (ref 4.0–10.5)
nRBC: 0 % (ref 0.0–0.2)

## 2018-03-11 LAB — HIV ANTIBODY (ROUTINE TESTING W REFLEX): HIV Screen 4th Generation wRfx: NONREACTIVE

## 2018-03-11 LAB — C DIFFICILE QUICK SCREEN W PCR REFLEX
C Diff antigen: NEGATIVE
C Diff interpretation: NOT DETECTED
C Diff toxin: NEGATIVE

## 2018-03-11 LAB — PATHOLOGIST SMEAR REVIEW

## 2018-03-11 MED ORDER — MORPHINE SULFATE (PF) 2 MG/ML IV SOLN
2.0000 mg | INTRAVENOUS | Status: AC | PRN
  Administered 2018-03-12 (×2): 2 mg via INTRAVENOUS
  Filled 2018-03-11 (×2): qty 1

## 2018-03-11 MED ORDER — TIZANIDINE HCL 4 MG PO TABS
2.0000 mg | ORAL_TABLET | Freq: Three times a day (TID) | ORAL | Status: DC
  Administered 2018-03-11 – 2018-03-14 (×8): 2 mg via ORAL
  Filled 2018-03-11 (×9): qty 1

## 2018-03-11 MED ORDER — LOPERAMIDE HCL 2 MG PO CAPS
4.0000 mg | ORAL_CAPSULE | Freq: Once | ORAL | Status: AC
  Administered 2018-03-11: 4 mg via ORAL
  Filled 2018-03-11: qty 2

## 2018-03-11 MED ORDER — SODIUM CHLORIDE 0.9 % IV BOLUS
500.0000 mL | Freq: Once | INTRAVENOUS | Status: AC
  Administered 2018-03-12: 500 mL via INTRAVENOUS

## 2018-03-11 MED ORDER — TIZANIDINE HCL 4 MG PO TABS: 2.0000 mg | ORAL_TABLET | Freq: Three times a day (TID) | ORAL | Status: DC | PRN

## 2018-03-11 MED ORDER — LOPERAMIDE HCL 2 MG PO CAPS
2.0000 mg | ORAL_CAPSULE | ORAL | Status: DC | PRN
  Administered 2018-03-11 – 2018-03-13 (×4): 2 mg via ORAL
  Filled 2018-03-11 (×5): qty 1

## 2018-03-11 NOTE — Progress Notes (Signed)
Initial Nutrition Assessment  INTERVENTION:   -D/c Ensure supplements per pt request -Provide Carnation Instant Breakfast TID with soy milk, each provides 220 kcal and 13g protein  NUTRITION DIAGNOSIS:   Increased nutrient needs related to cancer and cancer related treatments, diarrhea as evidenced by estimated needs.  GOAL:   Patient will meet greater than or equal to 90% of their needs  MONITOR:   PO intake, Supplement acceptance, Weight trends, Labs, I & O's  REASON FOR ASSESSMENT:   Consult Assessment of nutrition requirement/status  ASSESSMENT:   63 y.o. 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 Admitted for increased weakness, chills/fever and diarrhea.  Patient in room eating her breakfast of a vegetable omelet and potatoes. Pt with multiple juices, bananas and applesauces on tray.  Per patient, she has been having diarrhea for about 2 weeks now. Denies vomiting but states ~2 hours following a meal she will have loose stools. C.diff is negative.  Reviewed what was present on patient's tray and recommended pt not consume apple juice as this can make diarrhea worse. Encouraged consumption of stool bulking foods such as applesauce and bananas. Pt states she is a vegetarian.  Pt does not care for Ensure supplements and states she was told to avoid dairy products. She is willing to try [employer] packets with soy milk as an alternative.  Per patient, UBW is 140-145 lb. Pt states at her last chemo appointment on 3/2 she weighed 136 lb. Her weight has increased since to 149 lb this admission.  Medications reviewed. Labs reviewed:  Low Na  NUTRITION - FOCUSED PHYSICAL EXAM:  Nutrition focused physical exam shows no sign of depletion of muscle mass or body fat.  Diet Order:   Diet Order            Diet regular Room service appropriate? Yes; Fluid consistency: Thin  Diet effective  now              EDUCATION NEEDS:   Education needs have been addressed  Skin:  Skin Assessment: Reviewed RN Assessment  Last BM:  3/10  Height:   Ht Readings from Last 1 Encounters:  03/10/18 5\' 6"  (1.676 m)    Weight:   Wt Readings from Last 1 Encounters:  03/10/18 68.1 kg    Ideal Body Weight:  59.1 kg  BMI:  Body mass index is 24.23 kg/m.  Estimated Nutritional Needs:   Kcal:  1900-2100  Protein:  95-105g  Fluid:  2L/day  Clayton Bibles, MS, RD, LDN [place] [facility] Pager: [phone] After Hours Pager: [phone]

## 2018-03-11 NOTE — Progress Notes (Addendum)
PROGRESS NOTE                                                                                                                                                                                                             Patient Demographics:    Laura Woodward, is a 63 y.o. female, DOB - 1955/04/28, LYY:[id number]  Admit date - 03/10/2018   Admitting Physician Costin Karlyne Greenspan, MD  Outpatient Primary MD for the patient is Patient, No Pcp Per  LOS - 1  Outpatient Specialists: GYN oncology at [facility], palliative care specialist at [place] [facility]  Patient presents with  . Shortness of Breath       Brief Narrative 63 year old female with recently diagnosed moderately differentiated small cell carcinoma of the cervix (stage IV) who underwent palliative radiation 2 weeks ago followed by 1 dose of chemotherapy (carboplatin, paclitaxel and bevacizumab on 03/03/2018).  She was treated for symptomatic UTI until the day of admission. For about 10 days prior to admission patient having frequent episodes of diarrhea (episodes of watery and loose bowel movement followed by several hours of abstinence). She also had subjective fevers with chills, nausea, poor appetite and generalized weakness. Patient found to have sepsis with neutropenic fever and admitted to hospital service.   Subjective:   Patient reports she was still having episodes of diarrhea.  T-max 100.7 F.  Nausea better but still feels very weak.  Patient unhappy that she was not getting her gabapentin and Zanaflex as she was supposed to.   Assessment  & Plan :   Principal Problem:  Sepsis ([place])   Neutropenic fever ([place]) Likely associated with recent chemotherapy.  A1c of 450 on presentation.  (Improved to 750 this morning) Continue neutropenic precautions.  Continue empiric IV vancomycin and cefepime.    Stool for C. difficile negative.  Flagyl discontinued.  Patient  refused Lomotil as it would make her constipated. Flu PCR negative. Continue aggressive IV hydration. Aggressive IV hydration with normal saline at 125 cc/h.  Repeat lactic acid. Blood cultures so far negative.  Urine culture pending.    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  Completed radiation to the left ischial met and inguinal adenopathy about 2  weeks back and received first cycle of chemotherapy 1 week ago.  Follows with GYN oncology at [facility].    Also has been seeing palliative care team at [facility] (who has been adjusting her gabapentin and Zanaflex) Will communicate with her oncologist as needed.  Peripheral neuropathy Resumed her gabapentin and Zanaflex as outpatient.  Anal irritation and pain Continue Anusol cream   Code Status : Full code  Family Communication  : None at bedside  Disposition Plan  : Home once clinically improved, possibly in the next 48 hours  Barriers For Discharge :   Consults  : None  Procedures  : None  DVT Prophylaxis  : Subcu Lovenox  Lab Results  Component Value Date   PLT 191 03/11/2018    Antibiotics  :    Anti-infectives (From admission, onward)   Start     Dose/Rate Route Frequency Ordered Stop   03/11/18 0400  vancomycin (VANCOCIN) IVPB 750 mg/150 ml premix     750 mg 150 mL/hr over 60 Minutes Intravenous Every 12 hours 03/10/18 1447     03/10/18 2359  metroNIDAZOLE (FLAGYL) IVPB 500 mg  Status:  Discontinued    Note to Pharmacy:  D/c if stool for cdiff is ruled out   500 mg 100 mL/hr over 60 Minutes Intravenous Every 8 hours 03/10/18 1749 03/11/18 0719   03/10/18 2300  ceFEPIme (MAXIPIME) 2 g in sodium chloride 0.9 % 100 mL IVPB     2 g 200 mL/hr over 30 Minutes Intravenous Every 8 hours 03/10/18 1720     03/10/18 1432  vancomycin (VANCOCIN) 1000 MG powder      Note to Pharmacy:  Bertis Ruddy   : cabinet override      03/10/18 1432 03/11/18 0244   03/10/18 1432  vancomycin (VANCOCIN) 500 MG powder    Note to Pharmacy:  Bertis Ruddy   : cabinet override      03/10/18 1432 03/11/18 0244   03/10/18 1430  ceFEPIme (MAXIPIME) 2 g in sodium chloride 0.9 % 100 mL IVPB  Status:  Discontinued     2 g 200 mL/hr over 30 Minutes Intravenous Every 12 hours 03/10/18 1425 03/10/18 1741   03/10/18 1430  vancomycin (VANCOCIN) 1,500 mg in sodium chloride 0.9 % 500 mL IVPB     1,500 mg 250 mL/hr over 120 Minutes Intravenous  Once 03/10/18 1427 03/10/18 1724   03/10/18 1429  ceFEPIme (MAXIPIME) 2 g injection  Status:  Discontinued    Note to Pharmacy:  Lucilla Lame  : cabinet override      03/10/18 1429 03/10/18 1749   03/10/18 1415  aztreonam (AZACTAM) 2 g in sodium chloride 0.9 % 100 mL IVPB  Status:  Discontinued     2 g 200 mL/hr over 30 Minutes Intravenous  Once 03/10/18 1413 03/10/18 1425   03/10/18 1415  metroNIDAZOLE (FLAGYL) IVPB 500 mg     500 mg 100 mL/hr over 60 Minutes Intravenous  Once 03/10/18 1413 03/10/18 1548   03/10/18 1415  vancomycin (VANCOCIN) IVPB 1000 mg/200 mL premix  Status:  Discontinued     1,000 mg 200 mL/hr over 60 Minutes Intravenous  Once 03/10/18 1413 03/10/18 1427        Objective:   Vitals:   03/10/18 1712 03/10/18 2153 03/11/18 0036 03/11/18 0531  BP: 104/65 132/78  108/61  Pulse: 88 96  82  Resp: '18 17  16  '$ Temp: 99.6 F (37.6 C) (!) 102.7 F (39.3 C) 99.1 F (37.3 C)  97.9 F (36.6 C)  TempSrc: Oral Oral Oral Oral  SpO2: 100% 98%  97%  Weight: 68.1 kg     Height: '5\' 6"'$  (1.676 m)       Wt Readings from Last 3 Encounters:  03/10/18 68.1 kg     Intake/Output Summary (Last 24 hours) at 03/11/2018 1200 Last data filed at 03/11/2018 1152 Gross per 24 hour  Intake 3222.14 ml  Output -  Net 3222.14 ml    Physical exam Fatigued, not in distress HEENT: No pallor, no oral thrush, moist mucosa,  supple neck Chest: Clear bilaterally CVs: Normal S1-S2, no murmurs GI: Soft, nondistended, nontender, bowel sounds present Musculoskeletal: Warm, no edema    Data Review:    CBC Recent Labs  Lab 03/10/18 1332 03/10/18 1750 03/11/18 0518  WBC 0.9* 0.9* 1.5*  HGB 11.3* 9.6* 8.7*  HCT 34.9* 32.1* 27.2*  PLT 243 192 191  MCV 87.7 95.5 91.0  MCH 28.4 28.6 29.1  MCHC 32.4 29.9* 32.0  RDW 13.0 13.2 13.2  LYMPHSABS 0.1*  --  0.1*  MONOABS 0.3  --  0.7  EOSABS 0.0  --  0.1  BASOSABS 0.0  --  0.0    Chemistries  Recent Labs  Lab 03/10/18 1332 03/10/18 1750  NA 133*  --   K 3.5  --   CL 96*  --   CO2 24  --   GLUCOSE 123*  --   BUN 11  --   CREATININE 0.79 0.69  CALCIUM 9.2  --   AST 21  --   ALT 28  --   ALKPHOS 120  --   BILITOT 0.8  --    ------------------------------------------------------------------------------------------------------------------ No results for input(s): CHOL, HDL, LDLCALC, TRIG, CHOLHDL, LDLDIRECT in the last 72 hours.  No results found for: HGBA1C ------------------------------------------------------------------------------------------------------------------ No results for input(s): TSH, T4TOTAL, T3FREE, THYROIDAB in the last 72 hours.  Invalid input(s): FREET3 ------------------------------------------------------------------------------------------------------------------ No results for input(s): VITAMINB12, FOLATE, FERRITIN, TIBC, IRON, RETICCTPCT in the last 72 hours.  Coagulation profile No results for input(s): INR, PROTIME in the last 168 hours.  No results for input(s): DDIMER in the last 72 hours.  Cardiac Enzymes No results for input(s): CKMB, TROPONINI, MYOGLOBIN in the last 168 hours.  Invalid input(s): CK ------------------------------------------------------------------------------------------------------------------ No results found for: BNP  Inpatient Medications  Scheduled Meds: . enoxaparin (LOVENOX)  injection  40 mg Subcutaneous Q24H  . gabapentin  600 mg Oral TID  . oxybutynin  5 mg Oral QHS  . tiZANidine  2 mg Oral Q8H   Continuous Infusions: . sodium chloride 125 mL/hr at 03/11/18 1152  . ceFEPime (MAXIPIME) IV Stopped (03/11/18 0708)  . vancomycin 750 mg (03/11/18 0455)   PRN Meds:.acetaminophen **OR** [DISCONTINUED] acetaminophen, hydrocortisone, ibuprofen, ondansetron **OR** ondansetron (ZOFRAN) IV  Micro Results Recent Results (from the past 240 hour(s))  C difficile quick scan w PCR reflex     Status: None   Collection Time: 03/10/18 12:47 PM  Result Value Ref Range Status   C Diff antigen NEGATIVE NEGATIVE Final   C Diff toxin NEGATIVE NEGATIVE Final   C Diff interpretation No C. difficile detected.  Final    Comment: Performed at [facility], [place] [address] [place], [place] [address]  Blood Culture (routine x 2)     Status: None (Preliminary result)   Collection Time: 03/10/18  1:32 PM  Result Value Ref Range Status   Specimen Description   Final    BLOOD RIGHT ANTECUBITAL Performed at  [place], [address] [place], [place] [address]    Special Requests   Final    BOTTLES DRAWN AEROBIC AND ANAEROBIC Blood Culture adequate volume Performed at [facility], [address] [place], [place] [address]    Culture   Final    NO GROWTH < 24 HOURS Performed at [facility], [place] [address], [place], [place] [address]    Report Status PENDING  Incomplete  Blood Culture (routine x 2)     Status: None (Preliminary result)   Collection Time: 03/10/18  2:07 PM  Result Value Ref Range Status   Specimen Description   Final    BLOOD LEFT ANTECUBITAL Performed at [facility], [address] [place], [place] [address]    Special Requests   Final    BOTTLES DRAWN AEROBIC AND ANAEROBIC Blood Culture adequate volume Performed at [facility], [address] [place], [place] [address]    Culture    Final    NO GROWTH < 24 HOURS Performed at [facility], [place] [address] [place],  [address]    Report Status PENDING  Incomplete    Radiology Reports Dg Chest 2 View  Result Date: 03/10/2018 CLINICAL DATA:  Shortness of breath. EXAM: CHEST - 2 VIEW COMPARISON:  None. FINDINGS: The heart size and mediastinal contours are within normal limits. Both lungs are clear. No pneumothorax or pleural effusion is noted. The visualized skeletal structures are unremarkable. IMPRESSION: No active cardiopulmonary disease. Electronically Signed   By: Marijo Conception, M.D.   On: 03/10/2018 13:42    Time Spent in minutes  25  Shefali Ng M.D on 03/11/2018 at 12:00 PM  Between 7am to 7pm - Pager - [phone]  After 7pm go to [email or web address] - password [facility]  [facility] -  Office  [phone]

## 2018-03-12 DIAGNOSIS — E876 Hypokalemia: Secondary | ICD-10-CM

## 2018-03-12 LAB — CBC WITH DIFFERENTIAL/PLATELET
Abs Immature Granulocytes: 0.17 10*3/uL — ABNORMAL HIGH (ref 0.00–0.07)
Basophils Absolute: 0.1 10*3/uL (ref 0.0–0.1)
Basophils Relative: 3 %
EOS PCT: 2 %
Eosinophils Absolute: 0.1 10*3/uL (ref 0.0–0.5)
HCT: 29.4 % — ABNORMAL LOW (ref 36.0–46.0)
Hemoglobin: 9.1 g/dL — ABNORMAL LOW (ref 12.0–15.0)
Immature Granulocytes: 6 %
Lymphocytes Relative: 9 %
Lymphs Abs: 0.3 10*3/uL — ABNORMAL LOW (ref 0.7–4.0)
MCH: 29.2 pg (ref 26.0–34.0)
MCHC: 31 g/dL (ref 30.0–36.0)
MCV: 94.2 fL (ref 80.0–100.0)
Monocytes Absolute: 0.9 10*3/uL (ref 0.1–1.0)
Monocytes Relative: 33 %
Neutro Abs: 1.3 10*3/uL — ABNORMAL LOW (ref 1.7–7.7)
Neutrophils Relative %: 47 %
Platelets: 199 10*3/uL (ref 150–400)
RBC: 3.12 MIL/uL — ABNORMAL LOW (ref 3.87–5.11)
RDW: 13.3 % (ref 11.5–15.5)
WBC: 2.8 10*3/uL — AB (ref 4.0–10.5)
nRBC: 0 % (ref 0.0–0.2)

## 2018-03-12 LAB — BASIC METABOLIC PANEL
ANION GAP: 9 (ref 5–15)
BUN: 12 mg/dL (ref 6–20)
CO2: 16 mmol/L — ABNORMAL LOW (ref 22–32)
Calcium: 7.5 mg/dL — ABNORMAL LOW (ref 8.9–10.3)
Chloride: 112 mmol/L — ABNORMAL HIGH (ref 98–111)
Creatinine, Ser: 0.6 mg/dL (ref 0.44–1.00)
GFR calc Af Amer: >60 mL/min (ref >60)
GFR calc non Af Amer: >60 mL/min (ref >60)
Glucose, Bld: 114 mg/dL — ABNORMAL HIGH (ref 70–99)
POTASSIUM: 3.1 mmol/L — AB (ref 3.5–5.1)
Sodium: 137 mmol/L (ref 135–145)

## 2018-03-12 LAB — URINE CULTURE
Culture: NO GROWTH
SPECIAL REQUESTS: NORMAL

## 2018-03-12 LAB — LACTIC ACID, PLASMA: Lactic Acid, Venous: 1.3 mmol/L (ref 0.5–1.9)

## 2018-03-12 MED ORDER — POTASSIUM CHLORIDE 10 MEQ/100ML IV SOLN
10.0000 meq | INTRAVENOUS | Status: AC
  Administered 2018-03-12 – 2018-03-13 (×4): 10 meq via INTRAVENOUS
  Filled 2018-03-12 (×6): qty 100

## 2018-03-12 MED ORDER — SODIUM CHLORIDE 0.9 % IV SOLN
INTRAVENOUS | Status: DC
  Administered 2018-03-12 – 2018-03-13 (×3): via INTRAVENOUS

## 2018-03-12 MED ORDER — MORPHINE SULFATE (PF) 2 MG/ML IV SOLN
2.0000 mg | INTRAVENOUS | Status: AC | PRN
  Administered 2018-03-12 – 2018-03-13 (×3): 2 mg via INTRAVENOUS
  Filled 2018-03-12 (×3): qty 1

## 2018-03-12 NOTE — Progress Notes (Addendum)
PROGRESS NOTE  Laura Woodward ZOX:[id number] DOB: 28-Feb-1955 DOA: 03/10/2018 PCP: Patient, No Pcp Per   LOS: 2 days   Brief narrative: 63 year old female with recently diagnosed moderately differentiated small cell carcinoma of the cervix (stage IV) who underwent palliative radiation 2 weeks ago followed by 1 dose of chemotherapy (carboplatin, paclitaxel and bevacizumab on 03/03/2018).  She was treated for symptomatic UTI until the day of admission. For about 10 days prior to admission patient having frequent episodes of diarrhea (episodes of watery and loose bowel movement followed by several hours of abstinence). She also had subjective fevers with chills, nausea, poor appetite and generalized weakness. Patient found to have sepsis with neutropenic fever and admitted to hospital service.  Subjective: Patient complains of multiple episodes of diarrhea but is reluctant to take Imodium. She was advised to take Imodium to help with her symptoms.  Complains of severe pain in the pelvic area from her prior radiation treatment from cervical cancer.  Assessment/Plan:  Principal Problem:   Neutropenic fever ([place]) Active Problems:   Squamous cell carcinoma of cervix (HCC)   Sepsis ([place])  Sepsis,Neutropenic fever present on admission. Likely associated with recent chemotherapy. A1c of 450 on presentation.   Improving WBC and absolute neutrophil count.  On IV vancomycin and cefepime.   Stool for C. difficile negative.    Patient was advised Lomotil for radiation-induced colitis/chemotherapy-induced diarrhea. Flu PCR negative. Continue with IV fluid hydration.  Check lactate levels which was elevated on presentation. Blood cultures negative in 2 days.  Urine culture negative so far.  Max of 98.9 F.  Blood pressure was marginally low on presentation which has improved at this time.  Check lactate today. Hypokalemia.  Will replace with IV due to poor oral intake diarrhea.  Check BMP in a.m.  Ongoing  diarrhea with volume depletion.  Continue on IV fluids.  Abdominal pain/pelvic pain.  Patient is requesting IV narcotics for pain relief.  She does have rash with morphine but has taken it with Benadryl so we will consider that for now.  Stage IV squamous cell carcinoma of cervix (HCC) Advanced stage IV moderately differentiated squamous cell carcinoma of the cervix with CAT scan showing pulmonary and osseous metastases. Completed radiation to the left ischial met and inguinal adenopathy about 2 weeks back and received first cycle of chemotherapy 1 week ago. Follows with GYN oncology at [facility].   Also has been seeing palliative care team at [facility] (who has been adjusting her gabapentin and Zanaflex).  We will continue follow-up at [facility] after discharge.  Peripheral neuropathy Continue gabapentin and Zanaflex as outpatient.  Anal irritation and pain Continue Anusol cream  VTE Prophylaxis: Lovenox subcu  Code Status: Full code  Family Communication: No one at bedside  Disposition Plan: Home likely in 1 to 2 days once her diarrhea has improved.  Continue hydration.  Check electrolytes in am.  Consultants:  None  Procedures:  None  Antibiotics: Anti-infectives (From admission, onward)   Start     Dose/Rate Route Frequency Ordered Stop   03/11/18 0400  vancomycin (VANCOCIN) IVPB 750 mg/150 ml premix     750 mg 150 mL/hr over 60 Minutes Intravenous Every 12 hours 03/10/18 1447     03/10/18 2359  metroNIDAZOLE (FLAGYL) IVPB 500 mg  Status:  Discontinued    Note to Pharmacy:  D/c if stool for cdiff is ruled out   500 mg 100 mL/hr over 60 Minutes Intravenous Every 8 hours 03/10/18 1749 03/11/18 0719   03/10/18 2300  ceFEPIme (MAXIPIME) 2 g in sodium chloride 0.9 % 100 mL IVPB     2 g 200 mL/hr over 30 Minutes Intravenous Every 8 hours 03/10/18 1720     03/10/18 1432  vancomycin (VANCOCIN) 1000 MG powder    Note to Pharmacy:  Bertis Ruddy   : cabinet override      03/10/18 1432  03/11/18 0244   03/10/18 1432  vancomycin (VANCOCIN) 500 MG powder    Note to Pharmacy:  Bertis Ruddy   : cabinet override      03/10/18 1432 03/11/18 0244   03/10/18 1430  ceFEPIme (MAXIPIME) 2 g in sodium chloride 0.9 % 100 mL IVPB  Status:  Discontinued     2 g 200 mL/hr over 30 Minutes Intravenous Every 12 hours 03/10/18 1425 03/10/18 1741   03/10/18 1430  vancomycin (VANCOCIN) 1,500 mg in sodium chloride 0.9 % 500 mL IVPB     1,500 mg 250 mL/hr over 120 Minutes Intravenous  Once 03/10/18 1427 03/10/18 1724   03/10/18 1429  ceFEPIme (MAXIPIME) 2 g injection  Status:  Discontinued    Note to Pharmacy:  Lucilla Lame  : cabinet override      03/10/18 1429 03/10/18 1749   03/10/18 1415  aztreonam (AZACTAM) 2 g in sodium chloride 0.9 % 100 mL IVPB  Status:  Discontinued     2 g 200 mL/hr over 30 Minutes Intravenous  Once 03/10/18 1413 03/10/18 1425   03/10/18 1415  metroNIDAZOLE (FLAGYL) IVPB 500 mg     500 mg 100 mL/hr over 60 Minutes Intravenous  Once 03/10/18 1413 03/10/18 1548   03/10/18 1415  vancomycin (VANCOCIN) IVPB 1000 mg/200 mL premix  Status:  Discontinued     1,000 mg 200 mL/hr over 60 Minutes Intravenous  Once 03/10/18 1413 03/10/18 1427       Objective: Vitals:   03/11/18 2321 03/12/18 0402  BP: 120/63 137/71  Pulse: 79 83  Resp:  17  Temp:  98.5 F (36.9 C)  SpO2:  100%    Intake/Output Summary (Last 24 hours) at 03/12/2018 1513 Last data filed at 03/12/2018 0615 Gross per 24 hour  Intake 632.48 ml  Output -  Net 632.48 ml   Filed Weights   03/10/18 1204 03/10/18 1440 03/10/18 1712  Weight: 61.7 kg 61.7 kg 68.1 kg   Body mass index is 24.23 kg/m.   Physical Exam: GENERAL: Patient is alert awake and oriented. Not in obvious distress. HENT: No scleral pallor or icterus. Pupils equally reactive to light. Oral mucosa is moist NECK: is supple, no palpable thyroid enlargement. CHEST: Clear to auscultation. No crackles or wheezes. Non tender on  palpation. Diminished breath sounds bilaterally. CVS: S1 and S2 heard, no murmur. Regular rate and rhythm. No pericardial rub. ABDOMEN: Soft, non-tender, bowel sounds are present. No palpable hepato-splenomegaly. EXTREMITIES: No edema. CNS: Cranial nerves are intact. No focal motor or sensory deficits. SKIN: warm and dry without rashes.  Data Review: I have personally reviewed the following laboratory data and studies,  CBC: Recent Labs  Lab 03/10/18 1332 03/10/18 1750 03/11/18 0518 03/12/18 0325  WBC 0.9* 0.9* 1.5* 2.8*  NEUTROABS 0.5*  --  0.5* 1.3*  HGB 11.3* 9.6* 8.7* 9.1*  HCT 34.9* 32.1* 27.2* 29.4*  MCV 87.7 95.5 91.0 94.2  PLT 243 192 191 417   Basic Metabolic Panel: Recent Labs  Lab 03/10/18 1332 03/10/18 1750 03/12/18 0325  NA 133*  --  137  K 3.5  --  3.1*  CL 96*  --  112*  CO2 24  --  16*  GLUCOSE 123*  --  114*  BUN 11  --  12  CREATININE 0.79 0.69 0.60  CALCIUM 9.2  --  7.5*   Liver Function Tests: Recent Labs  Lab 03/10/18 1332  AST 21  ALT 28  ALKPHOS 120  BILITOT 0.8  PROT 7.4  ALBUMIN 3.4*   Recent Labs  Lab 03/10/18 1332  LIPASE 17   No results for input(s): AMMONIA in the last 168 hours. Cardiac Enzymes: No results for input(s): CKTOTAL, CKMB, CKMBINDEX, TROPONINI in the last 168 hours. BNP (last 3 results) No results for input(s): BNP in the last 8760 hours.  ProBNP (last 3 results) No results for input(s): PROBNP in the last 8760 hours.  CBG: No results for input(s): GLUCAP in the last 168 hours. Recent Results (from the past 240 hour(s))  Urine culture     Status: None   Collection Time: 03/10/18 12:44 PM  Result Value Ref Range Status   Specimen Description   Final    URINE, CLEAN CATCH Performed at [facility], [address] [place], [place] [address]    Special Requests   Final    Normal Performed at [facility], [address] [place], [place] [address]    Culture   Final    NO GROWTH  Performed at [facility], [place] [address], [place], [place] [address]    Report Status 03/12/2018 FINAL  Final  C difficile quick scan w PCR reflex     Status: None   Collection Time: 03/10/18 12:47 PM  Result Value Ref Range Status   C Diff antigen NEGATIVE NEGATIVE Final   C Diff toxin NEGATIVE NEGATIVE Final   C Diff interpretation No C. difficile detected.  Final    Comment: Performed at [facility], [place] [address], [place], [place] [address]  Blood Culture (routine x 2)     Status: None (Preliminary result)   Collection Time: 03/10/18  1:32 PM  Result Value Ref Range Status   Specimen Description   Final    BLOOD RIGHT ANTECUBITAL Performed at [facility], [address] [place], [place] [address]    Special Requests   Final    BOTTLES DRAWN AEROBIC AND ANAEROBIC Blood Culture adequate volume Performed at [facility], [address] [place], [place] [address]    Culture   Final    NO GROWTH 2 DAYS Performed at [facility], [place] [address], [place], [place] [address]    Report Status PENDING  Incomplete  Blood Culture (routine x 2)     Status: None (Preliminary result)   Collection Time: 03/10/18  2:07 PM  Result Value Ref Range Status   Specimen Description   Final    BLOOD LEFT ANTECUBITAL Performed at [facility], [address] [place], [place] [address]    Special Requests   Final    BOTTLES DRAWN AEROBIC AND ANAEROBIC Blood Culture adequate volume Performed at [facility], [address] [place], [place] [address]    Culture   Final    NO GROWTH 2 DAYS Performed at [facility], [place] [address], [place], [place] [address]    Report Status PENDING  Incomplete     Studies: No results found.  Scheduled Meds: . enoxaparin (LOVENOX) injection  40  mg Subcutaneous Q24H  . gabapentin  600 mg Oral TID  . oxybutynin  5 mg Oral QHS  . tiZANidine  2 mg Oral Q8H    Continuous  Infusions: . ceFEPime (MAXIPIME) IV 2 g (03/12/18 0612)  . vancomycin 750 mg (03/12/18 0349)     Flora Lipps, MD  [facility] 03/12/2018

## 2018-03-13 LAB — CBC WITH DIFFERENTIAL/PLATELET
Abs Immature Granulocytes: 0.2 10*3/uL — ABNORMAL HIGH (ref 0.00–0.07)
Band Neutrophils: 10 %
Basophils Absolute: 0.1 10*3/uL (ref 0.0–0.1)
Basophils Relative: 1 %
Eosinophils Absolute: 0.1 10*3/uL (ref 0.0–0.5)
Eosinophils Relative: 1 %
HCT: 31.7 % — ABNORMAL LOW (ref 36.0–46.0)
Hemoglobin: 9.7 g/dL — ABNORMAL LOW (ref 12.0–15.0)
Lymphocytes Relative: 10 %
Lymphs Abs: 0.7 10*3/uL (ref 0.7–4.0)
MCH: 28.1 pg (ref 26.0–34.0)
MCHC: 30.6 g/dL (ref 30.0–36.0)
MCV: 91.9 fL (ref 80.0–100.0)
Metamyelocytes Relative: 3 %
Monocytes Absolute: 0.8 10*3/uL (ref 0.1–1.0)
Monocytes Relative: 12 %
NRBC: 0 % (ref 0.0–0.2)
Neutro Abs: 4.9 10*3/uL (ref 1.7–7.7)
Neutrophils Relative %: 63 %
Platelets: 305 10*3/uL (ref 150–400)
RBC: 3.45 MIL/uL — ABNORMAL LOW (ref 3.87–5.11)
RDW: 13.3 % (ref 11.5–15.5)
WBC: 6.7 10*3/uL (ref 4.0–10.5)
nRBC: 1 /100 WBC — ABNORMAL HIGH

## 2018-03-13 LAB — BASIC METABOLIC PANEL
Anion gap: 12 (ref 5–15)
BUN: 8 mg/dL (ref 6–20)
CO2: 18 mmol/L — ABNORMAL LOW (ref 22–32)
CREATININE: 0.62 mg/dL (ref 0.44–1.00)
Calcium: 8.2 mg/dL — ABNORMAL LOW (ref 8.9–10.3)
Chloride: 108 mmol/L (ref 98–111)
GFR calc Af Amer: >60 mL/min (ref >60)
GFR calc non Af Amer: >60 mL/min (ref >60)
Glucose, Bld: 100 mg/dL — ABNORMAL HIGH (ref 70–99)
Potassium: 3.5 mmol/L (ref 3.5–5.1)
Sodium: 138 mmol/L (ref 135–145)

## 2018-03-13 LAB — MAGNESIUM: MAGNESIUM: 1.9 mg/dL (ref 1.7–2.4)

## 2018-03-13 MED ORDER — SODIUM CHLORIDE 0.45 % IV SOLN: INTRAVENOUS | Status: DC

## 2018-03-13 MED ORDER — SODIUM CHLORIDE 0.45 % IV SOLN
INTRAVENOUS | Status: DC
  Administered 2018-03-13: 17:00:00 via INTRAVENOUS
  Filled 2018-03-13 (×3): qty 1000

## 2018-03-13 MED ORDER — LIDOCAINE 4 % EX CREA: TOPICAL_CREAM | Freq: Two times a day (BID) | CUTANEOUS | Status: DC | PRN

## 2018-03-13 MED ORDER — DIBUCAINE 1 % RE OINT
TOPICAL_OINTMENT | Freq: Three times a day (TID) | RECTAL | Status: DC | PRN
  Administered 2018-03-13: via RECTAL
  Filled 2018-03-13 (×2): qty 28

## 2018-03-13 NOTE — Progress Notes (Signed)
Pharmacy Antibiotic Note  Laura Woodward is a 63 y.o. female admitted on 03/10/2018 with febrile neutropenia.  Pharmacy has been consulted for vancomycin and cefepime dosing.    03/13/2018 Scr 0.62, CrCl ~ 5mls/min WBC 6.7 afebrile  Plan: Continue Vancomycin  750mg  IV every 12h (calc AUC 524, SCr 0.79) Continue Cefepime 2g IV every 8 hours Monitor renal function, Cx and clinical progression to narrow   Height: 5\' 6"  (167.6 cm) Weight: 150 lb 2.1 oz (68.1 kg) IBW/kg (Calculated) : 59.3  Temp (24hrs), Avg:99.4 F (37.4 C), Min:98.5 F (36.9 C), Max:100.8 F (38.2 C)  Recent Labs  Lab 03/10/18 1331 03/10/18 1332 03/10/18 1750 03/11/18 0518 03/12/18 0325 03/12/18 1544 03/13/18 0322  WBC  --  0.9* 0.9* 1.5* 2.8*  --  6.7  CREATININE  --  0.79 0.69  --  0.60  --  0.62  LATICACIDVEN 2.3*  --   --   --   --  1.3  --     Estimated Creatinine Clearance: 70 mL/min (by C-G formula based on SCr of 0.62 mg/dL).    Allergies  Allergen Reactions  . Sulfa Antibiotics     Leg swelling  . Ceftriaxone Nausea And Vomiting    Antimicrobials this admission: Vanc 3/9>> Cefepime 3/9>> Flagyl 3/9>>  Dose adjustments this admission: n/a  Microbiology results: 3/9 BCx: ngtd 3/9 UCx: ngf 3/9 CDiff: neg  Dolly Rias RPh 03/13/2018, 12:08 PM Pager [phone]

## 2018-03-13 NOTE — Consult Note (Addendum)
[facility] Nurse wound consult note Reason for Consult: Consult requested for radiation burns to perirectal area.  Pt states this has been a problem prior to admission, but it increased when she recently developed diarrhea.  Affected area is red and macerated with partial thickness skin loss and is very painful related to the recent occurrence of hemorrhoids, according to the patient. Site was not assessed; discussed possible topical treatments, since she states the steroid cream which has been prescribed is too painful for use.   Dressing procedure/placement/frequency: Pt is currently using barrier cream after each loose stool to the location to protect and repel moisture. Primary team; please consider ordering topical Lidocaine jelly, which can be mixed with barrier cream and applied to decrease pain.  Pt has used this before admission and states it was helpful.  Please re-consult if further assistance is needed.  Thank-you,  Julien Girt MSN, [place], [place], [place], [place]

## 2018-03-13 NOTE — Progress Notes (Signed)
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  Patient was treated for symptomatic UTI until the day of admission. For about 10 days prior to admission patient having frequent episodes of diarrhea (episodes of watery and loose bowel movement followed by several hours of abstinence). She also had subjective fevers with chills, nausea, poor appetite and generalized weakness. Patient found to have sepsis with neutropenic fever and admitted to hospital service.  Subjective: Patient complains of feeling little better today.  Still has multiple episodes of diarrhea but has decreased in volume.  Occasional bloody tinged diarrhea.  No nausea or vomiting today.  Assessment/Plan:  Principal Problem:   Neutropenic fever ([place]) Active Problems:   Squamous cell carcinoma of cervix ([facility])   Sepsis ([place])  Sepsis,Neutropenic fever present on admission. Likely associated with recent chemotherapy.  Neutropeniaresolved at this time.  Empirically on vancomycin and cefepime.  T-max 100.8 F.   Stool for C. difficile negative.  Flu PCR negative. Lactate has normalized after IV fluid hydration. Blood cultures negative so far..  Urine culture negative so far.   Ongoing diarrhea with volume depletion.  Continue on IV fluids.  Patient was encouraged to use loperamide.  Hypokalemia improved with replacement.  We will continue to replenish.  Abdominal pain/pelvic pain.  On morphine.  Patient had received palliative radiation in the past.  Stage IV squamous cell carcinoma of cervix ([place]) Advanced stage IV moderately differentiated squamous cell carcinoma of the cervix with CAT  scan showing pulmonary and osseous metastases. Completed radiation to the left ischial met and inguinal adenopathy about 2 weeks back and received first cycle of chemotherapy 1 week ago. Follows with GYN oncology at [facility].   Also has been seeing palliative care team at [facility] (who has been adjusting her gabapentin and Zanaflex).  We will continue follow-up at [facility] after discharge.  Peripheral neuropathy Continue gabapentin and Zanaflex as outpatient.  Anal irritation and pain from recent radiation/radiation proctitis. Continue Anusol cream  VTE Prophylaxis: Lovenox subcu  Code Status: Full code  Family Communication: No one at bedside  Disposition Plan: Home tomorrow if his diarrhea improves and is afebrile by tomorrow.  Diet as tolerated.   Consultants:  None  Procedures:  None  Antibiotics: Anti-infectives (From admission, onward)   Start     Dose/Rate Route Frequency Ordered Stop   03/11/18 0400  vancomycin (VANCOCIN) IVPB 750 mg/150 ml premix     750 mg 150 mL/hr over 60 Minutes Intravenous Every 12 hours 03/10/18 1447     03/10/18 2359  metroNIDAZOLE (FLAGYL) IVPB 500 mg  Status:  Discontinued    Note to Pharmacy:  D/c if stool for cdiff is ruled out   500 mg 100 mL/hr over 60 Minutes Intravenous Every 8 hours 03/10/18 1749 03/11/18 0719   03/10/18 2300  ceFEPIme (MAXIPIME) 2 g in sodium chloride 0.9 % 100 mL IVPB     2 g 200 mL/hr over 30 Minutes Intravenous Every 8 hours 03/10/18 1720     03/10/18 1432  vancomycin (VANCOCIN) 1000 MG powder    Note to Pharmacy:  Bertis Ruddy   : cabinet override      03/10/18 1432 03/11/18 0244   03/10/18 1432  vancomycin (  VANCOCIN) 500 MG powder    Note to Pharmacy:  Bertis Ruddy   : cabinet override      03/10/18 1432 03/11/18 0244   03/10/18 1430  ceFEPIme (MAXIPIME) 2 g in sodium chloride 0.9 % 100 mL IVPB  Status:  Discontinued     2 g 200 mL/hr over 30 Minutes Intravenous Every 12 hours 03/10/18 1425 03/10/18 1741    03/10/18 1430  vancomycin (VANCOCIN) 1,500 mg in sodium chloride 0.9 % 500 mL IVPB     1,500 mg 250 mL/hr over 120 Minutes Intravenous  Once 03/10/18 1427 03/10/18 1724   03/10/18 1429  ceFEPIme (MAXIPIME) 2 g injection  Status:  Discontinued    Note to Pharmacy:  Lucilla Lame  : cabinet override      03/10/18 1429 03/10/18 1749   03/10/18 1415  aztreonam (AZACTAM) 2 g in sodium chloride 0.9 % 100 mL IVPB  Status:  Discontinued     2 g 200 mL/hr over 30 Minutes Intravenous  Once 03/10/18 1413 03/10/18 1425   03/10/18 1415  metroNIDAZOLE (FLAGYL) IVPB 500 mg     500 mg 100 mL/hr over 60 Minutes Intravenous  Once 03/10/18 1413 03/10/18 1548   03/10/18 1415  vancomycin (VANCOCIN) IVPB 1000 mg/200 mL premix  Status:  Discontinued     1,000 mg 200 mL/hr over 60 Minutes Intravenous  Once 03/10/18 1413 03/10/18 1427      Objective: Vitals:   03/12/18 2138 03/13/18 0627  BP: 124/82 104/67  Pulse: 90 86  Resp: 16 16  Temp: 99.2 F (37.3 C) 98.5 F (36.9 C)  SpO2: 97% 99%    Intake/Output Summary (Last 24 hours) at 03/13/2018 1225 Last data filed at 03/13/2018 0535 Gross per 24 hour  Intake 3240.37 ml  Output -  Net 3240.37 ml   Filed Weights   03/10/18 1204 03/10/18 1440 03/10/18 1712  Weight: 61.7 kg 61.7 kg 68.1 kg   Body mass index is 24.23 kg/m.   Physical Exam: General: Alert awake communicative.  Not in obvious distress HENT: Normocephalic, pupils equally reacting to light and accommodation.  No scleral pallor or icterus noted. Oral mucosa is moist.  Chest:  Clear breath sounds.  Diminished breath sounds bilaterally. No crackles or wheezes.  CVS: S1 &S2 heard. No murmur.  Regular rate and rhythm. Abdomen: Soft, nontender, nondistended.  Bowel sounds are heard.  Liver is not palpable, no abdominal mass palpated Extremities: No cyanosis, clubbing or edema.  Peripheral pulses are palpable. Psych: Alert, awake and oriented, normal mood CNS:  No cranial nerve deficits.   Power equal in all extremities.  No sensory deficits noted.  No cerebellar signs.   Skin: Warm and dry.  No rashes noted.   Data Review: I have personally reviewed the following laboratory data and studies,  CBC: Recent Labs  Lab 03/10/18 1332 03/10/18 1750 03/11/18 0518 03/12/18 0325 03/13/18 0322  WBC 0.9* 0.9* 1.5* 2.8* 6.7  NEUTROABS 0.5*  --  0.5* 1.3* 4.9  HGB 11.3* 9.6* 8.7* 9.1* 9.7*  HCT 34.9* 32.1* 27.2* 29.4* 31.7*  MCV 87.7 95.5 91.0 94.2 91.9  PLT 243 192 191 199 315   Basic Metabolic Panel: Recent Labs  Lab 03/10/18 1332 03/10/18 1750 03/12/18 0325 03/13/18 0322  NA 133*  --  137 138  K 3.5  --  3.1* 3.5  CL 96*  --  112* 108  CO2 24  --  16* 18*  GLUCOSE 123*  --  114* 100*  BUN  11  --  12 8  CREATININE 0.79 0.69 0.60 0.62  CALCIUM 9.2  --  7.5* 8.2*  MG  --   --   --  1.9   Liver Function Tests: Recent Labs  Lab 03/10/18 1332  AST 21  ALT 28  ALKPHOS 120  BILITOT 0.8  PROT 7.4  ALBUMIN 3.4*   Recent Labs  Lab 03/10/18 1332  LIPASE 17   No results for input(s): AMMONIA in the last 168 hours. Cardiac Enzymes: No results for input(s): CKTOTAL, CKMB, CKMBINDEX, TROPONINI in the last 168 hours. BNP (last 3 results) No results for input(s): BNP in the last 8760 hours.  ProBNP (last 3 results) No results for input(s): PROBNP in the last 8760 hours.  CBG: No results for input(s): GLUCAP in the last 168 hours. Recent Results (from the past 240 hour(s))  Urine culture     Status: None   Collection Time: 03/10/18 12:44 PM  Result Value Ref Range Status   Specimen Description   Final    URINE, CLEAN CATCH Performed at [facility], [address] [place], [place] [address]    Special Requests   Final    Normal Performed at [facility], [address] [place], [place] [address]    Culture   Final    NO GROWTH Performed at [facility], [place] [address] [place], [place] [address]    Report Status  03/12/2018 FINAL  Final  C difficile quick scan w PCR reflex     Status: None   Collection Time: 03/10/18 12:47 PM  Result Value Ref Range Status   C Diff antigen NEGATIVE NEGATIVE Final   C Diff toxin NEGATIVE NEGATIVE Final   C Diff interpretation No C. difficile detected.  Final    Comment: Performed at [facility], [place] [address], [place], [place] [address]  Blood Culture (routine x 2)     Status: None (Preliminary result)   Collection Time: 03/10/18  1:32 PM  Result Value Ref Range Status   Specimen Description   Final    BLOOD RIGHT ANTECUBITAL Performed at [facility], [address] [place], [place] [address]    Special Requests   Final    BOTTLES DRAWN AEROBIC AND ANAEROBIC Blood Culture adequate volume Performed at [facility], [address] [place], [place] [address]    Culture   Final    NO GROWTH 3 DAYS Performed at [facility], [place] [address], [place], [place] [address]    Report Status PENDING  Incomplete  Blood Culture (routine x 2)     Status: None (Preliminary result)   Collection Time: 03/10/18  2:07 PM  Result Value Ref Range Status   Specimen Description   Final    BLOOD LEFT ANTECUBITAL Performed at [facility], [address] [place], [place] [address]    Special Requests   Final    BOTTLES DRAWN AEROBIC AND ANAEROBIC Blood Culture adequate volume Performed at [facility], [address] [place], [place] [address]    Culture   Final    NO GROWTH 3 DAYS Performed at [facility], [place] [address], [place], [place] [address]    Report Status PENDING  Incomplete     Studies: No results found.  Scheduled Meds: . enoxaparin (LOVENOX) injection  40 mg Subcutaneous Q24H  . gabapentin  600 mg Oral TID  . oxybutynin  5 mg Oral QHS  . tiZANidine  2 mg Oral Q8H    Continuous Infusions: . sodium chloride 100 mL/hr at 03/13/18 0505  . ceFEPime (MAXIPIME) IV 2 g (03/13/18  1100)  . vancomycin 750 mg (03/13/18 0415)     Flora Lipps, MD  [facility] 03/13/2018

## 2018-03-14 LAB — CBC
HCT: 28.7 % — ABNORMAL LOW (ref 36.0–46.0)
Hemoglobin: 8.9 g/dL — ABNORMAL LOW (ref 12.0–15.0)
MCH: 29 pg (ref 26.0–34.0)
MCHC: 31 g/dL (ref 30.0–36.0)
MCV: 93.5 fL (ref 80.0–100.0)
Platelets: 243 10*3/uL (ref 150–400)
RBC: 3.07 MIL/uL — ABNORMAL LOW (ref 3.87–5.11)
RDW: 13.7 % (ref 11.5–15.5)
WBC: 6.8 10*3/uL (ref 4.0–10.5)
nRBC: 0 % (ref 0.0–0.2)

## 2018-03-14 LAB — BASIC METABOLIC PANEL
Anion gap: 9 (ref 5–15)
BUN: 8 mg/dL (ref 6–20)
CHLORIDE: 105 mmol/L (ref 98–111)
CO2: 23 mmol/L (ref 22–32)
Calcium: 7.9 mg/dL — ABNORMAL LOW (ref 8.9–10.3)
Creatinine, Ser: 0.55 mg/dL (ref 0.44–1.00)
GFR calc Af Amer: >60 mL/min (ref >60)
GFR calc non Af Amer: >60 mL/min (ref >60)
Glucose, Bld: 105 mg/dL — ABNORMAL HIGH (ref 70–99)
Potassium: 3.1 mmol/L — ABNORMAL LOW (ref 3.5–5.1)
SODIUM: 137 mmol/L (ref 135–145)

## 2018-03-14 MED ORDER — POTASSIUM CHLORIDE 10 MEQ/100ML IV SOLN
10.0000 meq | INTRAVENOUS | Status: AC
  Administered 2018-03-14 (×4): 10 meq via INTRAVENOUS
  Filled 2018-03-14 (×4): qty 100

## 2018-03-14 MED ORDER — SODIUM CHLORIDE 0.9 % IV SOLN
INTRAVENOUS | Status: DC
  Administered 2018-03-14: 09:00:00 via INTRAVENOUS

## 2018-03-14 MED ORDER — AMOXICILLIN-POT CLAVULANATE 875-125 MG PO TABS: 1.0000 | ORAL_TABLET | Freq: Two times a day (BID) | ORAL | 0 refills | Status: AC

## 2018-03-14 MED ORDER — POTASSIUM CHLORIDE ER 20 MEQ PO TBCR: 20.0000 meq | EXTENDED_RELEASE_TABLET | Freq: Every day | ORAL | 0 refills | Status: DC

## 2018-03-14 MED ORDER — LIDOCAINE HCL URETHRAL/MUCOSAL 2 % EX GEL: 1.0000 "application " | Freq: Three times a day (TID) | CUTANEOUS | 0 refills | Status: DC | PRN

## 2018-03-14 MED ORDER — HYDROCORTISONE 2.5 % RE CREA: TOPICAL_CREAM | Freq: Four times a day (QID) | RECTAL | 0 refills | Status: DC | PRN

## 2018-03-14 NOTE — Discharge Summary (Signed)
Physician Discharge Summary  Laura Woodward EXH:[id number] DOB: 12-06-1955 DOA: 03/10/2018  PCP: Patient, No Pcp Per  Admit date: 03/10/2018 Discharge date: 03/14/2018  Admitted From: Home  Discharge disposition: Home   Recommendations for Outpatient Follow-Up:    Follow up with your primary care provider in one week.    Discharge Diagnosis:   Principal Problem:   Neutropenic fever ([place]) Active Problems:   Squamous cell carcinoma of cervix ([place])   Sepsis ([place])   Discharge Condition: Improved.  Diet recommendation: Low sodium, heart healthy.   Wound care: None.  Code status: Full.   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 Patient was treated for symptomatic UTI until the day of admission. For about 10 days prior to admission patient having frequent episodes of diarrhea (episodes of watery and loose bowel movement followed by several hours of abstinence). She also had subjective fevers with chills, nausea, poor appetite and generalized weakness. Patient found to have sepsis with neutropenic fever and admitted to hospital service.    Hospital Course:   Sepsis,Neutropenic fever present on admission. Likely associated with recent chemotherapy.  Neutropeniaresolved at this time.  Patient empirically received vancomycin and cefepime. Stool for C. difficile negative.  Flu PCR negative. Lactate normalized after IV fluid hydration.   Chest x-ray without any infiltrate.  Blood cultures and urine cultures were negative until the time of discharge.  Will be prescribed a course of Augmentin to complete the course of antibiotic.  Ongoing diarrhea with volume depletion.   With IV fluid and use of loperamide.  Patient likely has proctitis which  improved with lidocaine jelly.  Hypokalemia improved with replacement.    Patient will be prescribed potassium on discharge.  Will need BMP in few days to be followed with primary care physician  Abdominal pain/pelvic pain.  Patient had received palliative radiation in the past.  Advised to use lidocaine jelly after discharge.  Stage IV squamous cell carcinoma of cervix (HCC) Advanced stage IV moderately differentiated squamous cell carcinoma of the cervix with CAT scan showing pulmonary and osseous metastases. Completed radiation to the left ischial met and inguinal adenopathy about 2 weeks back and received first cycle of chemotherapy 1 week ago. Follows with G[facility] oncology at [facility].Also has been seeing palliative care team at [facility] (who has been adjusting her gabapentin and Zanaflex).    Patient was advised to  continue follow-up at [facility] after discharge.  Peripheral neuropathy Continue gabapentin and Zanaflex as outpatient.  Anal irritation and pain from recent radiation/radiation proctitis. Continue Anusol cream.  Added lidocaine jelly.  Disposition.  At this time patient is stable for disposition home.  Follow-up with her primary care physician and oncology as outpatient.    Medical Consultants:    None.   Subjective:   Today, patient feels much better and few stools but more formed.  Her rectal pain has improved with lidocaine jelly.  Discharge Exam:   Vitals:   03/13/18 2230 03/14/18 0602  BP: (!) 142/74 (!) 155/80  Pulse: 86 97  Resp: 18 16  Temp: 99.6 F (37.6 C) 99.8 F (37.7 C)  SpO2: 97% 98%   Vitals:   03/13/18 0627 03/13/18 1412 03/13/18 2230 03/14/18 0602  BP: 104/67 (!) 155/75 (!) 142/74 (!) 155/80  Pulse: 86 88 86 97  Resp: 16  $'18 18 16  'I$ Temp: 98.5 F (36.9 C) 98.5 F (36.9 C) 99.6 F (37.6 C) 99.8 F (37.7 C)  TempSrc: Oral Oral Oral Oral  SpO2: 99% 97% 97% 98%  Weight:      Height:        General exam: Appears calm and comfortable ,Not  in distress HEENT:PERRL,Oral mucosa moist Respiratory system: Bilateral equal air entry, normal vesicular breath sounds, no wheezes or crackles  Cardiovascular system: S1 & S2 heard, RRR.  Gastrointestinal system: Abdomen is nondistended, soft and nontender. No organomegaly or masses felt. Normal bowel sounds heard. Central nervous system: Alert and oriented. No focal neurological deficits. Extremities: No edema, no clubbing ,no cyanosis, distal peripheral pulses palpable. Skin: No rashes, lesions or ulcers,no icterus ,no pallor MSK: Normal muscle bulk,tone ,power    Procedures:    None  The results of significant diagnostics from this hospitalization (including imaging, microbiology, ancillary and laboratory) are listed below for reference.     Diagnostic Studies:   Dg Chest 2 View  Result Date: 03/10/2018 CLINICAL DATA:  Shortness of breath. EXAM: CHEST - 2 VIEW COMPARISON:  None. FINDINGS: The heart size and mediastinal contours are within normal limits. Both lungs are clear. No pneumothorax or pleural effusion is noted. The visualized skeletal structures are unremarkable. IMPRESSION: No active cardiopulmonary disease. Electronically Signed   By: Marijo Conception, M.D.   On: 03/10/2018 13:42     Labs:   Basic Metabolic Panel: Recent Labs  Lab 03/10/18 1332 03/10/18 1750 03/12/18 0325 03/13/18 0322 03/14/18 0658  NA 133*  --  137 138 137  K 3.5  --  3.1* 3.5 3.1*  CL 96*  --  112* 108 105  CO2 24  --  16* 18* 23  GLUCOSE 123*  --  114* 100* 105*  BUN 11  --  '12 8 8  '$ CREATININE 0.79 0.69 0.60 0.62 0.55  CALCIUM 9.2  --  7.5* 8.2* 7.9*  MG  --   --   --  1.9  --    GFR Estimated Creatinine Clearance: 70 mL/min (by C-G formula based on SCr of 0.55 mg/dL). Liver Function Tests: Recent Labs  Lab 03/10/18 1332  AST 21  ALT 28  ALKPHOS 120  BILITOT 0.8  PROT 7.4  ALBUMIN 3.4*   Recent Labs  Lab 03/10/18 1332  LIPASE 17   No results for input(s): AMMONIA in  the last 168 hours. Coagulation profile No results for input(s): INR, PROTIME in the last 168 hours.  CBC: Recent Labs  Lab 03/10/18 1332 03/10/18 1750 03/11/18 0518 03/12/18 0325 03/13/18 0322 03/14/18 0658  WBC 0.9* 0.9* 1.5* 2.8* 6.7 6.8  NEUTROABS 0.5*  --  0.5* 1.3* 4.9  --   HGB 11.3* 9.6* 8.7* 9.1* 9.7* 8.9*  HCT 34.9* 32.1* 27.2* 29.4* 31.7* 28.7*  MCV 87.7 95.5 91.0 94.2 91.9 93.5  PLT 243 192 191 199 305 243   Cardiac Enzymes: No results for input(s): CKTOTAL, CKMB, CKMBINDEX, TROPONINI in the last 168 hours. BNP: Invalid input(s): POCBNP CBG: No results for input(s): GLUCAP in the last 168 hours. D-Dimer No results for input(s): DDIMER in the last 72 hours. Hgb A1c No results for input(s): HGBA1C in the last 72 hours. Lipid Profile No results for input(s): CHOL, HDL, LDLCALC, TRIG, CHOLHDL, LDLDIRECT in the last 72 hours. Thyroid function studies No results for input(s): TSH, T4TOTAL, T3FREE, THYROIDAB in the last 72 hours.  Invalid input(s): FREET3 Anemia work up No results for  input(s): VITAMINB12, FOLATE, FERRITIN, TIBC, IRON, RETICCTPCT in the last 72 hours. Microbiology Recent Results (from the past 240 hour(s))  Urine culture     Status: None   Collection Time: 03/10/18 12:44 PM  Result Value Ref Range Status   Specimen Description   Final    URINE, CLEAN CATCH Performed at [facility], [address] [place], [place] [address]    Special Requests   Final    Normal Performed at [facility], [address] [place], [place] [address]    Culture   Final    NO GROWTH Performed at [facility], [place] [address], [place], [place] [address]    Report Status 03/12/2018 FINAL  Final  C difficile quick scan w PCR reflex     Status: None   Collection Time: 03/10/18 12:47 PM  Result Value Ref Range Status   C Diff antigen NEGATIVE NEGATIVE Final   C Diff toxin NEGATIVE NEGATIVE Final   C Diff interpretation No C.  difficile detected.  Final    Comment: Performed at [facility], [place] [address] [place], [place] [address]  Blood Culture (routine x 2)     Status: None (Preliminary result)   Collection Time: 03/10/18  1:32 PM  Result Value Ref Range Status   Specimen Description   Final    BLOOD RIGHT ANTECUBITAL Performed at [facility], [address] [place], [place] [address]    Special Requests   Final    BOTTLES DRAWN AEROBIC AND ANAEROBIC Blood Culture adequate volume Performed at [facility], [address] [place], [place] [address]    Culture   Final    NO GROWTH 3 DAYS Performed at [facility], [place] [address], [place], [place] [address]    Report Status PENDING  Incomplete  Blood Culture (routine x 2)     Status: None (Preliminary result)   Collection Time: 03/10/18  2:07 PM  Result Value Ref Range Status   Specimen Description   Final    BLOOD LEFT ANTECUBITAL Performed at [facility], [address] [place], [place] [address]    Special Requests   Final    BOTTLES DRAWN AEROBIC AND ANAEROBIC Blood Culture adequate volume Performed at [facility], [address] [place], [place] [address]    Culture   Final    NO GROWTH 3 DAYS Performed at [facility], [place] [address], [place], [place] [address]    Report Status PENDING  Incomplete     Discharge Instructions:   Discharge Instructions    Diet - low sodium heart healthy   Complete by:  As directed    Discharge instructions   Complete by:  As directed    Continue to take the loperamide as needed to keep the bowel movements 2-3 times a day.  Continue to take potassium as prescribed.  Follow-up with your primary care physician in 5 to 7 days to check your potassium levels.  Follow-up with your oncology physician as scheduled by you.   Increase activity slowly   Complete by:  As directed      Allergies as of 03/14/2018      Reactions   Sulfa  Antibiotics    Leg swelling   Ceftriaxone Nausea And Vomiting      Medication List    TAKE these medications   acetaminophen 500  MG tablet Commonly known as:  TYLENOL Take 1,000 mg by mouth daily as needed for moderate pain.   amoxicillin-clavulanate 875-125 MG tablet Commonly known as:  Augmentin Take 1 tablet by mouth 2 (two) times daily for 5 days.   dexamethasone 4 MG tablet Commonly known as:  DECADRON Take 4 mg by mouth daily.   gabapentin 300 MG capsule Commonly known as:  NEURONTIN Take 600 mg by mouth every 8 (eight) hours.   hydrocortisone 2.5 % rectal cream Commonly known as:  ANUSOL-HC Place rectally 4 (four) times daily as needed for hemorrhoids or anal itching.   ibuprofen 200 MG tablet Commonly known as:  ADVIL,MOTRIN Take 600 mg by mouth 3 (three) times daily as needed for fever or moderate pain.   lidocaine 2 % jelly Commonly known as:  XYLOCAINE Apply 1 application topically 3 (three) times daily as needed (pain). Mix with hemorrhoidal cream and apply What changed:  additional instructions   mirabegron ER 50 MG Tb24 tablet Commonly known as:  MYRBETRIQ Take by mouth.   Muscle Rub 10-15 % Crea Apply 1 application topically as needed for muscle pain.   nitrofurantoin (macrocrystal-monohydrate) 100 MG capsule Commonly known as:  MACROBID Take 100 mg by mouth every 12 (twelve) hours.   ondansetron 8 MG tablet Commonly known as:  ZOFRAN Take 8 mg by mouth every 8 (eight) hours as needed for nausea or vomiting.   oxybutynin 5 MG 24 hr tablet Commonly known as:  DITROPAN-XL Take 5 mg by mouth at bedtime.   Potassium Chloride ER 20 MEQ Tbcr Take 20 mEq by mouth daily for 10 days.   prochlorperazine 10 MG tablet Commonly known as:  COMPAZINE Take 10 mg by mouth every 8 (eight) hours as needed for nausea or vomiting.   tiZANidine 2 MG tablet Commonly known as:  ZANAFLEX Take 4 mg by mouth every 8 (eight) hours.   traZODone 50 MG tablet  Commonly known as:  DESYREL Take 50 mg by mouth at bedtime as needed for sleep.   trospium 20 MG tablet Commonly known as:  SANCTURA Take 20 mg by mouth.         Time coordinating discharge: 39 minutes  Signed:  Mintie Witherington  [facility] 03/14/2018, 1:10 PM

## 2018-03-15 LAB — CULTURE, BLOOD (ROUTINE X 2)
Culture: NO GROWTH
Culture: NO GROWTH
Special Requests: ADEQUATE
Special Requests: ADEQUATE

## 2018-03-19 DIAGNOSIS — E876 Hypokalemia: Secondary | ICD-10-CM | POA: Diagnosis not present

## 2018-03-19 DIAGNOSIS — C787 Secondary malignant neoplasm of liver and intrahepatic bile duct: Secondary | ICD-10-CM | POA: Diagnosis not present

## 2018-03-19 DIAGNOSIS — C539 Malignant neoplasm of cervix uteri, unspecified: Secondary | ICD-10-CM | POA: Diagnosis not present

## 2018-03-31 DIAGNOSIS — Z119 Encounter for screening for infectious and parasitic diseases, unspecified: Secondary | ICD-10-CM | POA: Diagnosis not present

## 2018-03-31 DIAGNOSIS — Z5111 Encounter for antineoplastic chemotherapy: Secondary | ICD-10-CM | POA: Diagnosis not present

## 2018-03-31 DIAGNOSIS — C539 Malignant neoplasm of cervix uteri, unspecified: Secondary | ICD-10-CM | POA: Diagnosis not present

## 2018-04-04 DIAGNOSIS — Z5111 Encounter for antineoplastic chemotherapy: Secondary | ICD-10-CM | POA: Diagnosis not present

## 2018-04-07 DIAGNOSIS — T470X5A Adverse effect of histamine H2-receptor blockers, initial encounter: Secondary | ICD-10-CM | POA: Diagnosis not present

## 2018-04-07 DIAGNOSIS — Z87891 Personal history of nicotine dependence: Secondary | ICD-10-CM | POA: Diagnosis not present

## 2018-04-07 DIAGNOSIS — L299 Pruritus, unspecified: Secondary | ICD-10-CM | POA: Diagnosis not present

## 2018-04-07 DIAGNOSIS — Z5111 Encounter for antineoplastic chemotherapy: Secondary | ICD-10-CM | POA: Diagnosis not present

## 2018-04-07 DIAGNOSIS — C539 Malignant neoplasm of cervix uteri, unspecified: Secondary | ICD-10-CM | POA: Diagnosis not present

## 2018-04-25 DIAGNOSIS — Z5111 Encounter for antineoplastic chemotherapy: Secondary | ICD-10-CM | POA: Diagnosis not present

## 2018-04-28 DIAGNOSIS — Z5111 Encounter for antineoplastic chemotherapy: Secondary | ICD-10-CM | POA: Diagnosis not present

## 2018-04-28 DIAGNOSIS — Z5112 Encounter for antineoplastic immunotherapy: Secondary | ICD-10-CM | POA: Diagnosis not present

## 2018-04-28 DIAGNOSIS — C539 Malignant neoplasm of cervix uteri, unspecified: Secondary | ICD-10-CM | POA: Diagnosis not present

## 2018-05-19 DIAGNOSIS — N3941 Urge incontinence: Secondary | ICD-10-CM | POA: Diagnosis not present

## 2018-05-19 DIAGNOSIS — C7951 Secondary malignant neoplasm of bone: Secondary | ICD-10-CM | POA: Diagnosis not present

## 2018-05-19 DIAGNOSIS — K6289 Other specified diseases of anus and rectum: Secondary | ICD-10-CM | POA: Diagnosis not present

## 2018-05-19 DIAGNOSIS — C7800 Secondary malignant neoplasm of unspecified lung: Secondary | ICD-10-CM | POA: Diagnosis not present

## 2018-05-19 DIAGNOSIS — Z5111 Encounter for antineoplastic chemotherapy: Secondary | ICD-10-CM | POA: Diagnosis not present

## 2018-05-19 DIAGNOSIS — R0609 Other forms of dyspnea: Secondary | ICD-10-CM | POA: Diagnosis not present

## 2018-05-19 DIAGNOSIS — R350 Frequency of micturition: Secondary | ICD-10-CM | POA: Diagnosis not present

## 2018-05-19 DIAGNOSIS — R590 Localized enlarged lymph nodes: Secondary | ICD-10-CM | POA: Diagnosis not present

## 2018-05-19 DIAGNOSIS — R432 Parageusia: Secondary | ICD-10-CM | POA: Diagnosis not present

## 2018-05-19 DIAGNOSIS — C539 Malignant neoplasm of cervix uteri, unspecified: Secondary | ICD-10-CM | POA: Diagnosis not present

## 2018-05-19 DIAGNOSIS — C7802 Secondary malignant neoplasm of left lung: Secondary | ICD-10-CM | POA: Diagnosis not present

## 2018-05-19 DIAGNOSIS — R809 Proteinuria, unspecified: Secondary | ICD-10-CM | POA: Diagnosis not present

## 2018-05-19 DIAGNOSIS — C7801 Secondary malignant neoplasm of right lung: Secondary | ICD-10-CM | POA: Diagnosis not present

## 2018-05-19 DIAGNOSIS — R5383 Other fatigue: Secondary | ICD-10-CM | POA: Diagnosis not present

## 2018-05-19 DIAGNOSIS — Z923 Personal history of irradiation: Secondary | ICD-10-CM | POA: Diagnosis not present

## 2018-05-19 DIAGNOSIS — Z79899 Other long term (current) drug therapy: Secondary | ICD-10-CM | POA: Diagnosis not present

## 2018-06-06 DIAGNOSIS — Z5111 Encounter for antineoplastic chemotherapy: Secondary | ICD-10-CM | POA: Diagnosis not present

## 2018-06-09 DIAGNOSIS — R350 Frequency of micturition: Secondary | ICD-10-CM | POA: Diagnosis not present

## 2018-06-09 DIAGNOSIS — C539 Malignant neoplasm of cervix uteri, unspecified: Secondary | ICD-10-CM | POA: Diagnosis not present

## 2018-06-09 DIAGNOSIS — Z5111 Encounter for antineoplastic chemotherapy: Secondary | ICD-10-CM | POA: Diagnosis not present

## 2018-06-27 DIAGNOSIS — C539 Malignant neoplasm of cervix uteri, unspecified: Secondary | ICD-10-CM | POA: Diagnosis not present

## 2018-06-30 DIAGNOSIS — Z5111 Encounter for antineoplastic chemotherapy: Secondary | ICD-10-CM | POA: Diagnosis not present

## 2018-06-30 DIAGNOSIS — Z5112 Encounter for antineoplastic immunotherapy: Secondary | ICD-10-CM | POA: Diagnosis not present

## 2018-06-30 DIAGNOSIS — C539 Malignant neoplasm of cervix uteri, unspecified: Secondary | ICD-10-CM | POA: Diagnosis not present

## 2018-07-03 DIAGNOSIS — G893 Neoplasm related pain (acute) (chronic): Secondary | ICD-10-CM | POA: Diagnosis not present

## 2018-07-03 DIAGNOSIS — M62838 Other muscle spasm: Secondary | ICD-10-CM | POA: Diagnosis not present

## 2018-07-03 DIAGNOSIS — Z515 Encounter for palliative care: Secondary | ICD-10-CM | POA: Diagnosis not present

## 2018-07-21 DIAGNOSIS — R935 Abnormal findings on diagnostic imaging of other abdominal regions, including retroperitoneum: Secondary | ICD-10-CM | POA: Diagnosis not present

## 2018-07-21 DIAGNOSIS — R911 Solitary pulmonary nodule: Secondary | ICD-10-CM | POA: Diagnosis not present

## 2018-07-21 DIAGNOSIS — Z5111 Encounter for antineoplastic chemotherapy: Secondary | ICD-10-CM | POA: Diagnosis not present

## 2018-07-21 DIAGNOSIS — R3989 Other symptoms and signs involving the genitourinary system: Secondary | ICD-10-CM | POA: Diagnosis not present

## 2018-07-21 DIAGNOSIS — C539 Malignant neoplasm of cervix uteri, unspecified: Secondary | ICD-10-CM | POA: Diagnosis not present

## 2018-07-28 DIAGNOSIS — Z5112 Encounter for antineoplastic immunotherapy: Secondary | ICD-10-CM | POA: Diagnosis not present

## 2018-07-28 DIAGNOSIS — C539 Malignant neoplasm of cervix uteri, unspecified: Secondary | ICD-10-CM | POA: Diagnosis not present

## 2018-07-28 DIAGNOSIS — Z5111 Encounter for antineoplastic chemotherapy: Secondary | ICD-10-CM | POA: Diagnosis not present

## 2018-08-14 DIAGNOSIS — C539 Malignant neoplasm of cervix uteri, unspecified: Secondary | ICD-10-CM | POA: Diagnosis not present

## 2018-08-18 DIAGNOSIS — Z5111 Encounter for antineoplastic chemotherapy: Secondary | ICD-10-CM | POA: Diagnosis not present

## 2018-08-18 DIAGNOSIS — Z5112 Encounter for antineoplastic immunotherapy: Secondary | ICD-10-CM | POA: Diagnosis not present

## 2018-08-18 DIAGNOSIS — C539 Malignant neoplasm of cervix uteri, unspecified: Secondary | ICD-10-CM | POA: Diagnosis not present

## 2018-10-20 DIAGNOSIS — Z23 Encounter for immunization: Secondary | ICD-10-CM | POA: Diagnosis not present

## 2018-10-20 DIAGNOSIS — L02224 Furuncle of groin: Secondary | ICD-10-CM | POA: Diagnosis not present

## 2018-10-20 DIAGNOSIS — R590 Localized enlarged lymph nodes: Secondary | ICD-10-CM | POA: Diagnosis not present

## 2018-10-20 DIAGNOSIS — R351 Nocturia: Secondary | ICD-10-CM | POA: Diagnosis not present

## 2018-10-20 DIAGNOSIS — L02234 Carbuncle of groin: Secondary | ICD-10-CM | POA: Diagnosis not present

## 2018-10-20 DIAGNOSIS — R350 Frequency of micturition: Secondary | ICD-10-CM | POA: Diagnosis not present

## 2018-10-20 DIAGNOSIS — C539 Malignant neoplasm of cervix uteri, unspecified: Secondary | ICD-10-CM | POA: Diagnosis not present

## 2018-10-20 DIAGNOSIS — R911 Solitary pulmonary nodule: Secondary | ICD-10-CM | POA: Diagnosis not present

## 2018-10-20 DIAGNOSIS — C538 Malignant neoplasm of overlapping sites of cervix uteri: Secondary | ICD-10-CM | POA: Diagnosis not present

## 2018-10-20 DIAGNOSIS — C7800 Secondary malignant neoplasm of unspecified lung: Secondary | ICD-10-CM | POA: Diagnosis not present

## 2018-10-20 DIAGNOSIS — R935 Abnormal findings on diagnostic imaging of other abdominal regions, including retroperitoneum: Secondary | ICD-10-CM | POA: Diagnosis not present

## 2018-10-20 DIAGNOSIS — C7951 Secondary malignant neoplasm of bone: Secondary | ICD-10-CM | POA: Diagnosis not present

## 2018-10-24 DIAGNOSIS — D235 Other benign neoplasm of skin of trunk: Secondary | ICD-10-CM | POA: Diagnosis not present

## 2019-11-22 DIAGNOSIS — C800 Disseminated malignant neoplasm, unspecified: Secondary | ICD-10-CM | POA: Insufficient documentation

## 2019-12-18 ENCOUNTER — Other Ambulatory Visit: Payer: Self-pay

## 2019-12-18 ENCOUNTER — Emergency Department (HOSPITAL_BASED_OUTPATIENT_CLINIC_OR_DEPARTMENT_OTHER): Payer: BC Managed Care – PPO

## 2019-12-18 ENCOUNTER — Encounter (HOSPITAL_BASED_OUTPATIENT_CLINIC_OR_DEPARTMENT_OTHER): Payer: Self-pay

## 2019-12-18 ENCOUNTER — Inpatient Hospital Stay (HOSPITAL_BASED_OUTPATIENT_CLINIC_OR_DEPARTMENT_OTHER)
Admission: EM | Admit: 2019-12-18 | Discharge: 2019-12-24 | DRG: 371 | Disposition: A | Discharge status: Hospice | Payer: BC Managed Care – PPO | Attending: Internal Medicine | Admitting: Internal Medicine

## 2019-12-18 DIAGNOSIS — Z882 Allergy status to sulfonamides status: Secondary | ICD-10-CM

## 2019-12-18 DIAGNOSIS — E039 Hypothyroidism, unspecified: Secondary | ICD-10-CM | POA: Diagnosis present

## 2019-12-18 DIAGNOSIS — Z66 Do not resuscitate: Secondary | ICD-10-CM | POA: Diagnosis present

## 2019-12-18 DIAGNOSIS — I10 Essential (primary) hypertension: Secondary | ICD-10-CM | POA: Diagnosis present

## 2019-12-18 DIAGNOSIS — C786 Secondary malignant neoplasm of retroperitoneum and peritoneum: Secondary | ICD-10-CM | POA: Diagnosis present

## 2019-12-18 DIAGNOSIS — Z7189 Other specified counseling: Secondary | ICD-10-CM

## 2019-12-18 DIAGNOSIS — Z20822 Contact with and (suspected) exposure to covid-19: Secondary | ICD-10-CM | POA: Diagnosis present

## 2019-12-18 DIAGNOSIS — E871 Hypo-osmolality and hyponatremia: Secondary | ICD-10-CM | POA: Diagnosis present

## 2019-12-18 DIAGNOSIS — Z888 Allergy status to other drugs, medicaments and biological substances status: Secondary | ICD-10-CM

## 2019-12-18 DIAGNOSIS — K650 Generalized (acute) peritonitis: Principal | ICD-10-CM | POA: Diagnosis present

## 2019-12-18 DIAGNOSIS — Z79899 Other long term (current) drug therapy: Secondary | ICD-10-CM

## 2019-12-18 DIAGNOSIS — D849 Immunodeficiency, unspecified: Secondary | ICD-10-CM | POA: Diagnosis present

## 2019-12-18 DIAGNOSIS — R180 Malignant ascites: Secondary | ICD-10-CM | POA: Diagnosis present

## 2019-12-18 DIAGNOSIS — R000 Tachycardia, unspecified: Secondary | ICD-10-CM

## 2019-12-18 DIAGNOSIS — Z515 Encounter for palliative care: Secondary | ICD-10-CM

## 2019-12-18 DIAGNOSIS — Z8249 Family history of ischemic heart disease and other diseases of the circulatory system: Secondary | ICD-10-CM

## 2019-12-18 DIAGNOSIS — K219 Gastro-esophageal reflux disease without esophagitis: Secondary | ICD-10-CM | POA: Diagnosis present

## 2019-12-18 DIAGNOSIS — R531 Weakness: Secondary | ICD-10-CM | POA: Diagnosis not present

## 2019-12-18 DIAGNOSIS — C539 Malignant neoplasm of cervix uteri, unspecified: Secondary | ICD-10-CM | POA: Diagnosis present

## 2019-12-18 DIAGNOSIS — L03311 Cellulitis of abdominal wall: Secondary | ICD-10-CM | POA: Diagnosis present

## 2019-12-18 DIAGNOSIS — Z79890 Hormone replacement therapy (postmenopausal): Secondary | ICD-10-CM

## 2019-12-18 DIAGNOSIS — J189 Pneumonia, unspecified organism: Secondary | ICD-10-CM | POA: Diagnosis present

## 2019-12-18 DIAGNOSIS — N739 Female pelvic inflammatory disease, unspecified: Secondary | ICD-10-CM | POA: Diagnosis present

## 2019-12-18 DIAGNOSIS — D630 Anemia in neoplastic disease: Secondary | ICD-10-CM | POA: Diagnosis present

## 2019-12-18 DIAGNOSIS — E876 Hypokalemia: Secondary | ICD-10-CM | POA: Diagnosis not present

## 2019-12-18 DIAGNOSIS — J811 Chronic pulmonary edema: Secondary | ICD-10-CM

## 2019-12-18 DIAGNOSIS — K659 Peritonitis, unspecified: Secondary | ICD-10-CM | POA: Diagnosis present

## 2019-12-18 DIAGNOSIS — C7951 Secondary malignant neoplasm of bone: Secondary | ICD-10-CM | POA: Diagnosis present

## 2019-12-18 DIAGNOSIS — E872 Acidosis: Secondary | ICD-10-CM | POA: Diagnosis present

## 2019-12-18 LAB — CBC WITH DIFFERENTIAL/PLATELET
Abs Immature Granulocytes: 0.14 10*3/uL — ABNORMAL HIGH (ref 0.00–0.07)
Basophils Absolute: 0 10*3/uL (ref 0.0–0.1)
Basophils Relative: 0 %
Eosinophils Absolute: 0.2 10*3/uL (ref 0.0–0.5)
Eosinophils Relative: 2 %
HCT: 28.9 % — ABNORMAL LOW (ref 36.0–46.0)
Hemoglobin: 9.2 g/dL — ABNORMAL LOW (ref 12.0–15.0)
Immature Granulocytes: 1 %
Lymphocytes Relative: 7 %
Lymphs Abs: 0.8 10*3/uL (ref 0.7–4.0)
MCH: 28 pg (ref 26.0–34.0)
MCHC: 31.8 g/dL (ref 30.0–36.0)
MCV: 87.8 fL (ref 80.0–100.0)
Monocytes Absolute: 1 10*3/uL (ref 0.1–1.0)
Monocytes Relative: 10 %
Neutro Abs: 8.3 10*3/uL — ABNORMAL HIGH (ref 1.7–7.7)
Neutrophils Relative %: 80 %
Platelets: 528 10*3/uL — ABNORMAL HIGH (ref 150–400)
RBC: 3.29 MIL/uL — ABNORMAL LOW (ref 3.87–5.11)
RDW: 14.6 % (ref 11.5–15.5)
WBC: 10.4 10*3/uL (ref 4.0–10.5)
nRBC: 0 % (ref 0.0–0.2)

## 2019-12-18 LAB — BODY FLUID CELL COUNT WITH DIFFERENTIAL
Eos, Fluid: 0 %
Lymphs, Fluid: 35 %
Monocyte-Macrophage-Serous Fluid: 11 % — ABNORMAL LOW (ref 50–90)
Neutrophil Count, Fluid: 54 % — ABNORMAL HIGH (ref 0–25)
Total Nucleated Cell Count, Fluid: 930 cu mm (ref 0–1000)

## 2019-12-18 LAB — LACTIC ACID, PLASMA: Lactic Acid, Venous: 1.7 mmol/L (ref 0.5–1.9)

## 2019-12-18 LAB — URINALYSIS, ROUTINE W REFLEX MICROSCOPIC
Bilirubin Urine: NEGATIVE
Glucose, UA: NEGATIVE mg/dL
Ketones, ur: NEGATIVE mg/dL
Leukocytes,Ua: NEGATIVE
Nitrite: POSITIVE — AB
Protein, ur: NEGATIVE mg/dL
Specific Gravity, Urine: <1.005 — ABNORMAL LOW (ref 1.005–1.030)
pH: 5 (ref 5.0–8.0)

## 2019-12-18 LAB — COMPREHENSIVE METABOLIC PANEL
ALT: 28 U/L (ref 0–44)
AST: 43 U/L — ABNORMAL HIGH (ref 15–41)
Albumin: 2.3 g/dL — ABNORMAL LOW (ref 3.5–5.0)
Alkaline Phosphatase: 197 U/L — ABNORMAL HIGH (ref 38–126)
Anion gap: 13 (ref 5–15)
BUN: 11 mg/dL (ref 8–23)
CO2: 21 mmol/L — ABNORMAL LOW (ref 22–32)
Calcium: 8.6 mg/dL — ABNORMAL LOW (ref 8.9–10.3)
Chloride: 100 mmol/L (ref 98–111)
Creatinine, Ser: 0.79 mg/dL (ref 0.44–1.00)
GFR, Estimated: >60 mL/min (ref >60)
Glucose, Bld: 123 mg/dL — ABNORMAL HIGH (ref 70–99)
Potassium: 3.5 mmol/L (ref 3.5–5.1)
Sodium: 134 mmol/L — ABNORMAL LOW (ref 135–145)
Total Bilirubin: 0.4 mg/dL (ref 0.3–1.2)
Total Protein: 6 g/dL — ABNORMAL LOW (ref 6.5–8.1)

## 2019-12-18 LAB — URINALYSIS, MICROSCOPIC (REFLEX)

## 2019-12-18 LAB — RESP PANEL BY RT-PCR (FLU A&B, COVID) ARPGX2
Influenza A by PCR: NEGATIVE
Influenza B by PCR: NEGATIVE
SARS Coronavirus 2 by RT PCR: NEGATIVE

## 2019-12-18 LAB — ALBUMIN, PLEURAL OR PERITONEAL FLUID: Albumin, Fluid: 1.4 g/dL

## 2019-12-18 LAB — GRAM STAIN

## 2019-12-18 LAB — GLUCOSE, PLEURAL OR PERITONEAL FLUID: Glucose, Fluid: 98 mg/dL

## 2019-12-18 LAB — APTT: aPTT: 26 seconds (ref 24–36)

## 2019-12-18 LAB — PROTIME-INR
INR: 1 (ref 0.8–1.2)
Prothrombin Time: 13.2 seconds (ref 11.4–15.2)

## 2019-12-18 LAB — PROTEIN, PLEURAL OR PERITONEAL FLUID: Total protein, fluid: <3 g/dL

## 2019-12-18 LAB — LACTATE DEHYDROGENASE, PLEURAL OR PERITONEAL FLUID: LD, Fluid: 256 U/L — ABNORMAL HIGH (ref 3–23)

## 2019-12-18 MED ORDER — SODIUM CHLORIDE 0.9 % IV BOLUS
1000.0000 mL | Freq: Once | INTRAVENOUS | Status: AC
  Administered 2019-12-18: 1000 mL via INTRAVENOUS

## 2019-12-18 MED ORDER — SODIUM CHLORIDE 0.9 % IV SOLN
2.0000 g | Freq: Three times a day (TID) | INTRAVENOUS | Status: DC
  Administered 2019-12-19 – 2019-12-21 (×8): 2 g via INTRAVENOUS
  Filled 2019-12-18 (×8): qty 2

## 2019-12-18 MED ORDER — VANCOMYCIN HCL IN DEXTROSE 1-5 GM/200ML-% IV SOLN
1000.0000 mg | Freq: Once | INTRAVENOUS | Status: AC
  Administered 2019-12-18: 1000 mg via INTRAVENOUS
  Filled 2019-12-18: qty 200

## 2019-12-18 MED ORDER — VANCOMYCIN HCL 1500 MG/300ML IV SOLN
1500.0000 mg | INTRAVENOUS | Status: DC
  Administered 2019-12-20: 17:00:00 1500 mg via INTRAVENOUS
  Filled 2019-12-18 (×3): qty 300

## 2019-12-18 MED ORDER — SODIUM CHLORIDE 0.9 % IV SOLN
2.0000 g | Freq: Once | INTRAVENOUS | Status: AC
  Administered 2019-12-18: 2 g via INTRAVENOUS

## 2019-12-18 MED ORDER — VANCOMYCIN HCL 500 MG IV SOLR
INTRAVENOUS | Status: AC
  Filled 2019-12-18: qty 500

## 2019-12-18 MED ORDER — MORPHINE SULFATE (PF) 2 MG/ML IV SOLN
2.0000 mg | Freq: Once | INTRAVENOUS | Status: AC
  Administered 2019-12-18: 22:00:00 2 mg via INTRAVENOUS
  Filled 2019-12-18: qty 1

## 2019-12-18 MED ORDER — METRONIDAZOLE 500 MG PO TABS
500.0000 mg | ORAL_TABLET | Freq: Three times a day (TID) | ORAL | Status: DC
  Administered 2019-12-18 – 2019-12-21 (×8): 500 mg via ORAL
  Filled 2019-12-18 (×8): qty 1

## 2019-12-18 MED ORDER — LACTATED RINGERS IV SOLN: INTRAVENOUS | Status: AC

## 2019-12-18 MED ORDER — IOHEXOL 300 MG/ML  SOLN
100.0000 mL | Freq: Once | INTRAMUSCULAR | Status: AC | PRN
  Administered 2019-12-18: 17:00:00 100 mL via INTRAVENOUS

## 2019-12-18 MED ORDER — VANCOMYCIN HCL 10 G IV SOLR
500.0000 mg | Freq: Once | INTRAVENOUS | Status: AC
  Administered 2019-12-18: 20:00:00 500 mg via INTRAVENOUS
  Filled 2019-12-18: qty 500

## 2019-12-18 MED ORDER — FAMOTIDINE IN NACL 20-0.9 MG/50ML-% IV SOLN
20.0000 mg | Freq: Once | INTRAVENOUS | Status: AC
  Administered 2019-12-18: 16:00:00 20 mg via INTRAVENOUS
  Filled 2019-12-18: qty 50

## 2019-12-18 MED ORDER — AZTREONAM 2 G IJ SOLR
INTRAMUSCULAR | Status: AC
  Filled 2019-12-18: qty 2

## 2019-12-18 NOTE — ED Notes (Signed)
Attempted to provided phone handoff report to rec RN at [facility], no answer when call [facility]

## 2019-12-18 NOTE — ED Notes (Signed)
Vanco IVPB placed on pause for pt to go to CT scan with contrast

## 2019-12-18 NOTE — ED Notes (Signed)
CBC, CMET, Lactic Acid, BC x 2 obtained and to the lab

## 2019-12-18 NOTE — ED Notes (Signed)
[facility] AT BEDSIDE 

## 2019-12-18 NOTE — ED Triage Notes (Signed)
Pt presents with complaints of worsening weakness, fever and fatigue over last several days. Is currently being treated for cancer of cervix, lungs and lymph nodes per pt.

## 2019-12-18 NOTE — ED Provider Notes (Signed)
[facility] EMERGENCY DEPARTMENT Provider Note   CSN: [id number] Arrival date & time: 12/18/19  1358     History Chief Complaint  Patient presents with  . Weakness    Laura Woodward is a 64 y.o. female.  HPI    Patient with significant medical history of stage IV squamous cell carcinoma, ascites from malignancy, hypertension  presents to the emergency department with chief complaint of feeling weak.  Patient states she has felt weak since Tuesday after her chemo infusion, she states she has been unable to perform daily life activities, states she has had decreased appetite, and has felt slightly nauseous, with decrease in urine output despite adequate fluid hydration.  She noted that she had a slight cough yesterday which was productive and then today had a fever of 101.  She denies headaches, change in vision, nasal congestion, sore throat, chest pain, shortness of breath, abdominal pain, urinary symptoms, vaginal pain or vaginal bleeding, pedal edema.  She states she is not currently vaccine against Covid, denies recent sick contacts, is immunocompromise at this time.  She states that she normally drains about a liter of fluid from her abdomen every other day, last time was drained was on Tuesday, she was unable to perform this yesterday as she was too weak to do it.  She states she is live alone and has no one to help her.  Patient denies any alleviating pain.    Past Medical History:  Diagnosis Date  . Cancer [facility])     Patient Active Problem List   Diagnosis Date Noted  . Neutropenic fever ([place]) 03/10/2018  . SIRS (systemic inflammatory response syndrome) ([place]) 03/10/2018  . Squamous cell carcinoma of cervix ([place]) 03/10/2018  . Sepsis ([place]) 03/10/2018    Past Surgical History:  Procedure Laterality Date  . ABDOMINAL SURGERY    . TONSILLECTOMY       OB History   No obstetric history on file.     No family history on file.  Social History   Tobacco Use   . Smoking status: Never Smoker  . Smokeless tobacco: Never Used  Substance Use Topics  . Alcohol use: Not Currently  . Drug use: Never    Home Medications Prior to Admission medications   Medication Sig Start Date End Date Taking? Authorizing Provider  acetaminophen (TYLENOL) 500 MG tablet Take 1,000 mg by mouth daily as needed for moderate pain.     [provider]  dexamethasone (DECADRON) 4 MG tablet Take 4 mg by mouth daily.  02/20/18   [provider]  gabapentin (NEURONTIN) 300 MG capsule Take 600 mg by mouth every 8 (eight) hours.  03/05/18 03/05/19  [provider]  hydrocortisone (ANUSOL-HC) 2.5 % rectal cream Place rectally 4 (four) times daily as needed for hemorrhoids or anal itching. 03/14/18   Pokhrel, Corrie Mckusick, MD  ibuprofen (ADVIL,MOTRIN) 200 MG tablet Take 600 mg by mouth 3 (three) times daily as needed for fever or moderate pain.     [provider]  lidocaine (XYLOCAINE) 2 % jelly Apply 1 application topically 3 (three) times daily as needed (pain). Mix with hemorrhoidal cream and apply 03/14/18   Pokhrel, Corrie Mckusick, MD  Menthol-Methyl Salicylate (MUSCLE RUB) 10-15 % CREA Apply 1 application topically as needed for muscle pain.    [provider]  mirabegron ER (MYRBETRIQ) 50 MG TB24 tablet Take by mouth. 10/23/17   [provider]  nitrofurantoin, macrocrystal-monohydrate, (MACROBID) 100 MG capsule Take 100 mg by mouth every  12 (twelve) hours. 03/04/18   [provider]  ondansetron (ZOFRAN) 8 MG tablet Take 8 mg by mouth every 8 (eight) hours as needed for nausea or vomiting.  03/04/18   [provider]  oxybutynin (DITROPAN-XL) 5 MG 24 hr tablet Take 5 mg by mouth at bedtime.  12/30/17   [provider]  potassium chloride 20 MEQ TBCR Take 20 mEq by mouth daily for 10 days. 03/14/18 03/24/18  Pokhrel, Corrie Mckusick, MD  prochlorperazine (COMPAZINE) 10 MG tablet Take 10 mg by mouth every 8 (eight) hours as needed for  nausea or vomiting.     [provider]  traZODone (DESYREL) 50 MG tablet Take 50 mg by mouth at bedtime as needed for sleep.  02/20/18 02/20/19  [provider]  trospium (SANCTURA) 20 MG tablet Take 20 mg by mouth.  01/08/18   [provider]    Allergies    Sulfa antibiotics and Ceftriaxone  Review of Systems   Review of Systems  Constitutional: Positive for activity change, appetite change, fatigue and fever. Negative for chills.  HENT: Negative for congestion and tinnitus.   Respiratory: Positive for cough. Negative for shortness of breath.   Cardiovascular: Negative for chest pain.  Gastrointestinal: Positive for diarrhea and nausea. Negative for abdominal pain and vomiting.  Genitourinary: Positive for difficulty urinating. Negative for dysuria, enuresis, flank pain, vaginal bleeding and vaginal discharge.  Musculoskeletal: Negative for back pain.  Skin: Negative for rash.  Neurological: Negative for headaches.  Hematological: Does not bruise/bleed easily.    Physical Exam Updated Vital Signs BP 125/70 (BP Location: Right Arm)   Pulse (!) 118   Temp 99.4 F (37.4 C) (Oral)   Resp (!) 26   Ht 5\' 6"  (1.676 m)   Wt 71.7 kg   SpO2 96%   BMI 25.50 kg/m   Physical Exam Vitals and nursing note reviewed.  Constitutional:      General: She is not in acute distress.    Appearance: She is ill-appearing and toxic-appearing.  HENT:     Head: Normocephalic and atraumatic.     Right Ear: Tympanic membrane, ear canal and external ear normal.     Left Ear: Tympanic membrane, ear canal and external ear normal.     Nose: Congestion present.     Comments: Turbinates were erythematous, nasal congestion present    Mouth/Throat:     Mouth: Mucous membranes are moist.     Pharynx: Oropharynx is clear. No oropharyngeal exudate or posterior oropharyngeal erythema.  Eyes:     Extraocular Movements: Extraocular movements intact.     Conjunctiva/sclera:  Conjunctivae normal.     Pupils: Pupils are equal, round, and reactive to light.  Cardiovascular:     Rate and Rhythm: Regular rhythm. Tachycardia present.     Pulses: Normal pulses.     Heart sounds: No murmur heard. No friction rub. No gallop.   Pulmonary:     Effort: No respiratory distress.     Breath sounds: No wheezing, rhonchi or rales.  Abdominal:     General: There is distension.     Comments: Patient's abdomen was visualized it was distended, with abdominal Pleurx catheter in place in the right lower quadrant, with overlying erythematous skin changes noted, no discharge or drainage noted, no fluctuance or indurations felt during palpation.  Abdomen had normoactive bowel sounds, it was dull to percussion, no tenderness to palpation, no peritoneal sign no rebound tenderness.  Musculoskeletal:     Cervical back: Normal  range of motion. No tenderness.     Right lower leg: No edema.     Left lower leg: No edema.     Comments: Patient is moving all 4 extremities at difficulty.  Skin:    General: Skin is warm and dry.     Coloration: Skin is pale.  Neurological:     Mental Status: She is alert.     Comments: Having no difficulty with word finding.  Psychiatric:        Mood and Affect: Mood normal.     ED Results / Procedures / Treatments   Labs (all labs ordered are listed, but only abnormal results are displayed) Labs Reviewed  CBC WITH DIFFERENTIAL/PLATELET - Abnormal; Notable for the following components:      Result Value   RBC 3.29 (*)    Hemoglobin 9.2 (*)    HCT 28.9 (*)    Platelets 528 (*)    Neutro Abs 8.3 (*)    Abs Immature Granulocytes 0.14 (*)    All other components within normal limits  COMPREHENSIVE METABOLIC PANEL - Abnormal; Notable for the following components:   Sodium 134 (*)    CO2 21 (*)    Glucose, Bld 123 (*)    Calcium 8.6 (*)    Total Protein 6.0 (*)    Albumin 2.3 (*)    AST 43 (*)    Alkaline Phosphatase 197 (*)    All other  components within normal limits  URINALYSIS, ROUTINE W REFLEX MICROSCOPIC - Abnormal; Notable for the following components:   Specific Gravity, Urine <1.005 (*)    Hgb urine dipstick MODERATE (*)    Nitrite POSITIVE (*)    All other components within normal limits  LACTATE DEHYDROGENASE, PLEURAL OR PERITONEAL FLUID - Abnormal; Notable for the following components:   LD, Fluid 256 (*)    All other components within normal limits  BODY FLUID CELL COUNT WITH DIFFERENTIAL - Abnormal; Notable for the following components:   Appearance, Fluid HAZY (*)    Neutrophil Count, Fluid 54 (*)    Monocyte-Macrophage-Serous Fluid 11 (*)    All other components within normal limits  URINALYSIS, MICROSCOPIC (REFLEX) - Abnormal; Notable for the following components:   Bacteria, UA FEW (*)    All other components within normal limits  RESP PANEL BY RT-PCR (FLU A&B, COVID) ARPGX2  GRAM STAIN  CULTURE, BLOOD (ROUTINE X 2)  CULTURE, BLOOD (ROUTINE X 2)  URINE CULTURE  BODY FLUID CULTURE  CULTURE, BODY FLUID-BOTTLE  LACTIC ACID, PLASMA  PROTIME-INR  APTT  GLUCOSE, PLEURAL OR PERITONEAL FLUID  PROTEIN, PLEURAL OR PERITONEAL FLUID  ALBUMIN, PLEURAL OR PERITONEAL FLUID  LACTIC ACID, PLASMA  PATHOLOGIST SMEAR REVIEW    EKG EKG Interpretation  Date/Time:  Friday December 18 2019 15:15:37 EST Ventricular Rate:  115 PR Interval:    QRS Duration: 86 QT Interval:  308 QTC Calculation: 426 R Axis:   88 Text Interpretation: Sinus tachycardia Borderline right axis deviation Borderline repolarization abnormality Baseline wander in lead(s) V5 No STEMI Confirmed by Octaviano Glow [phone]) on 12/18/2019 4:43:10 PM   Radiology CT ABDOMEN PELVIS W CONTRAST  Result Date: 12/18/2019 CLINICAL DATA:  Abdomen distension history of cervical cancer EXAM: CT ABDOMEN AND PELVIS WITH CONTRAST TECHNIQUE: Multidetector CT imaging of the abdomen and pelvis was performed using the standard protocol following bolus  administration of intravenous contrast. CONTRAST:  153mL OMNIPAQUE IOHEXOL 300 MG/ML  SOLN COMPARISON:  None. FINDINGS: Lower chest: Lung bases demonstrate small bilateral  pleural effusions and dependent lower lobe atelectasis. Borderline cardiomegaly. Small hiatal hernia. Hepatobiliary: No focal hepatic abnormality. No calcified gallstone or biliary dilatation Pancreas: Unremarkable. No pancreatic ductal dilatation or surrounding inflammatory changes. Spleen: Normal in size without focal abnormality. Adrenals/Urinary Tract: Adrenal glands are normal. Ureteral stent within the left kidney. There is mild bilateral hydronephrosis. Abnormal appearance of the bladder with irregular areas of wall thickening. Masslike thickening of the posterior wall of the bladder with soft tissue density near the left distal pigtail. Stomach/Bowel: The stomach is nonenlarged. Fluid-filled nondistended small bowel. No acute colon wall thickening. Vascular/Lymphatic: Nonaneurysmal aorta. Subcentimeter Peri aortic lymph nodes. Mildly enlarged bilateral nodes adjacent to the proximal external iliac arteries. On the right, this measures 11 mm and on the left, measures 14 mm. Reproductive: Fluid-filled structure within the central pelvis which appears contiguous with the vagina and presumably represents an obstructed uterus. Fluid visible within the vaginal cuff. Irregular soft tissue density in the region of the cervix which appears contiguous with irregular soft tissue density at the posterior aspect of the bladder concerning for bladder invasion. Possible dilated right fallopian tube. Other: No free air. Abdominal drainage catheter in the right lower quadrant with the tip terminating in the left mid abdomen. Moderate abdominal ascites and pelvic ascites. Nodular infiltration of the left upper quadrant mesentery and the omentum, consistent with peritoneal metastatic disease. Mild diffuse peritoneal enhancement. Rim enhancing loculated fluid  collections within the pelvis. The largest collection is seen posteriorly and measures 6.3 x 3.2 cm. Musculoskeletal: Sclerotic lesions within the left iliac bone, right sacrum and left ischium, concerning for metastatic disease. IMPRESSION: 1. Moderate abdominal and pelvic ascites with peritoneal drainage catheter in place. Mild diffuse peritoneal enhancement could be secondary to metastatic disease or peritonitis. Loculated/mildly rim enhancing fluid collection within the deep posterior pelvis measuring 6.3 cm. 2. Irregular soft tissue density in the region of the cervix, appears contiguous with lobulated soft tissue density at the posterior wall of the bladder concerning for invasion. There are other areas of irregular bladder wall thickening, concerning for neoplastic involvement. Nodular infiltration of the omentum and left upper quadrant mesentery consistent with peritoneal metastatic disease. Left ureteral stent in place. There is mild bilateral hydronephrosis, suspect secondary to mild obstruction from soft tissue mass at the posterior aspect of the bladder. 3. Fluid-filled structure within the central pelvis, contiguous with fluid-filled vagina presumably represents an obstructed uterus. 4. Small bilateral pleural effusions. 5. Metastatic disease of the pelvic bones. Enlarged pelvic lymph nodes consistent with metastatic disease Electronically Signed   By: Donavan Foil M.D.   On: 12/18/2019 18:01   DG Chest Portable 1 View  Result Date: 12/18/2019 CLINICAL DATA:  Chest pain, cough and weakness. EXAM: PORTABLE CHEST 1 VIEW COMPARISON:  03/10/2018 FINDINGS: Poor inspiration. Hazy/patchy infiltrates in the lower lungs. Small pleural effusion on the right. Findings consistent with bibasilar pneumonia. No dense consolidation or lobar collapse. No significant bone finding. IMPRESSION: Hazy/patchy infiltrates in the lower lungs consistent with bibasilar pneumonia. Small right effusion. Electronically Signed    By: Nelson Chimes M.D.   On: 12/18/2019 16:00    Procedures Procedures (including critical care time)  Medications Ordered in ED Medications  lactated ringers infusion (has no administration in time range)  metroNIDAZOLE (FLAGYL) tablet 500 mg (500 mg Oral Given 12/18/19 2151)  aztreonam (AZACTAM) 2 g injection (has no administration in time range)  aztreonam (AZACTAM) 2 g in sodium chloride 0.9 % 100 mL IVPB (has no administration in time  range)  vancomycin (VANCOREADY) IVPB 1500 mg/300 mL (has no administration in time range)  vancomycin (VANCOCIN) 500 MG powder (has no administration in time range)  sodium chloride 0.9 % bolus 1,000 mL (0 mLs Intravenous Stopped 12/18/19 1528)  aztreonam (AZACTAM) 2 g in sodium chloride 0.9 % 100 mL IVPB (0 g Intravenous Stopped 12/18/19 1700)  vancomycin (VANCOCIN) IVPB 1000 mg/200 mL premix (0 mg Intravenous Stopped 12/18/19 1956)  vancomycin (VANCOCIN) 500 mg in sodium chloride 0.9 % 100 mL IVPB (500 mg Intravenous New Bag/Given 12/18/19 1959)  famotidine (PEPCID) IVPB 20 mg premix (0 mg Intravenous Stopped 12/18/19 1628)  iohexol (OMNIPAQUE) 300 MG/ML solution 100 mL (100 mLs Intravenous Contrast Given 12/18/19 1722)  morphine 2 MG/ML injection 2 mg (2 mg Intravenous Given 12/18/19 2152)    ED Course  I have reviewed the triage vital signs and the nursing notes.  Pertinent labs & imaging results that were available during my care of the patient were reviewed by me and considered in my medical decision making (see chart for details).  Clinical Course as of 12/18/19 2209  Fri Dec 18, 2019  1555 64 yo female on chemotherapy w/ chronic ascites and abdominal drain presenting to Ed with fatigue, lethargy for 1 week.  On current chemo at [facility].  She reports no appetite or energy this week.  Reports fevers at home up to 101F today.  Denies abdominal pain.  On exam she has a fluid wave and large abdomin, no significant tenderness.  Drain site on abdomen  appears clear.  Lungs CTAB.  She is tachycardic HR 120, BP stable, RR in the 20's and 95% on room air.  She has not had the covid vaccines.  Sepsis protocol initiated, BS antibiotics ordered for PCN allergies.  Lactate 1.7, no absolute neutorpenia on CBC.  We'll drain 1 L abd fluid (as she is scheduled to do at home already) and sent the peritoneal fluid for infectious testing as well.   [MT]    Clinical Course User Index [MT] Trifan, Carola Rhine, MD   MDM Rules/Calculators/A&P                          Patient presents with generalized weakness.  She was alert, appeared to be chronically ill, pale, vital signs sent for tachycardia in oral temperature of 99.9.  Due being immunocompromised and toxic appearing on exam will order sepsis work-up and further evaluate.  Patient was reevaluated after a liter of peritoneal fluid was removed from patient abdomen, she states she is feeling much better, fluid was a golden color no signs of infection present.  Vital signs have remained stable, patient continues to be tachycardic.  Due to meeting SIRS criteria and being immunocompromised feel patient would benefit from inpatient management.  Will speak with hospitalist for further evaluation. Spoke with doctor Jonelle Sidle of the hospitalist team and he feels due to her significant oncologist history and being followed at [facility] we should reach out to [facility] to see if they can accept the patient.  Spoke with Dr. Patsey Berthold of [facility] oncology team and he informed me that patient thinking about  hospice care and feels that she should be treated for her pneumonia.  unfortunately they do not have any beds at [facility] at this time.  Advised that we following up with our hospitalist team to see if they can manage the patient and help her get set up with hospice care.  Spoke with Dr. Myna Hidalgo  of the hospitalist team he has accepted the patient,  he asked that we consult with IR see if they will evaluate patient's fluid collection for possible  abscess.  Spoke with Dr. Gilmer Mor of IR and he states he will come evaluate patient tomorrow morning.  CBC negative for leukocytosis, shows normocytic anemia appears to be a baseline for patient.  CMP shows slight hyponatremia of 161, metabolic acidosis with with a CO2 of 21, hyperglycemia of 132, hypoalbumin, lactate 1.7, prothrombin time 13.2, APTT 26 chest x-ray concerning for pneumonia has occasional/patchy infiltrates in the lower lobes consistent with bibasilar pneumonia with slight right pleural effusion.  CT imaging shows diffuse peritoneal enhancement which could be secondary to metastatic disease or peritonitis.  Also shows a deep tissue posterior fluid collection within the pelvis measuring 6.3 cm.  EKG shows sinus tach without signs of ischemia no ST elevation depression noted.  Low suspicion for ACS or cardiac abnormality as patient has chest pain, shortness of breath, no signs of hypoperfusion fluid overload noted on my exam.  Low suspicion worsening brain mets as there is no neuro deficits on my exam.  I have low suspicion for spontaneous bacterial peritonitis as abdomen is soft nontender to palpation, no signs of infection noted on my exam.  I suspect fluid collection within abdomen seen on CT is secondary to malignancy but cannot exclude possibility of an abscess for source of infection.  Patient was started on broad-spectrum antibiotics will cover for her pneumonia and possible abdominal abscess.  I have low suspicion for pyelonephritis or UTI as patient denies urinary symptoms, no CVA tenderness on exam there is no stranding or signs infection noted around kidneys on CT abdomen.  Patient does have positive nitrates with few bacteria will culture for further evaluation.  I suspect patient's difficulty with urination may be secondary to the masses pressing on her bladder causing her a bladder obstruction.  Anticipate patient will need further evaluation and IV antibiotics inpatient.  Patient care  will be given over to admitting team for further observation  Final Clinical Impression(s) / ED Diagnoses Final diagnoses:  Weakness  Community acquired pneumonia, unspecified laterality  Tachycardia    Rx / DC Orders ED Discharge Orders    None       Marcello Fennel, PA-C 12/18/19 2209    Wyvonnia Dusky, MD 12/19/19 [phone]

## 2019-12-18 NOTE — Progress Notes (Signed)
Pharmacy Antibiotic Note  Laura Woodward is a 64 y.o. female admitted on 12/18/2019 with sepsis.  Pharmacy has been consulted for Aztreonam and Vancomycin dosing.  Plan: Vancomycin 1500 mg IV every 24 hours.  Goal - MIC ~ 500 mg*hr Aztreonam 2 grams IV every 8 hours  Height: 5\' 6"  (167.6 cm) Weight: 71.7 kg (158 lb) IBW/kg (Calculated) : 59.3  Temp (24hrs), Avg:99.7 F (37.6 C), Min:99.4 F (37.4 C), Max:99.9 F (37.7 C)  Recent Labs  Lab 12/18/19 1455  WBC 10.4  CREATININE 0.79  LATICACIDVEN 1.7    Estimated Creatinine Clearance: 74 mL/min (by C-G formula based on SCr of 0.79 mg/dL).    Allergies  Allergen Reactions  . Sulfa Antibiotics     Leg swelling  . Ceftriaxone Nausea And Vomiting    Antimicrobials this admission: Vancomycin 1gm  And 500 mg iv once aztreonam 2 gm IV once Metronidazole 500 mg PO once    Microbiology results: pending  Thank you for allowing pharmacy to be a part of this patient's care.  Laura Woodward Laura Woodward 12/18/2019 3:44 PM

## 2019-12-18 NOTE — ED Notes (Signed)
Having lower abd pain, bladder type spasms, orders rec for pain med from ED provider

## 2019-12-18 NOTE — ED Notes (Signed)
Pt has tachycardia and tachypnea and fever.

## 2019-12-18 NOTE — ED Notes (Signed)
EDP ORDERS REC TO DRAIN 1 LITER OF PERITONEAL FLUID, ALSO COLLECT FLUID FOR ORDERS LABS. VS ASSESSED PRIOR TO PROCEDURE, TOLERATING PROCEDURE WELL, PURULENT / STRAW COLORED FLUID NOTED IN BOTTLE, 1LITER FLUID DRAINED SLOWLY

## 2019-12-18 NOTE — ED Notes (Signed)
Patient transported to CT 

## 2019-12-18 NOTE — ED Notes (Signed)
Placed on cont cardiac monitoring with cont POX and int NBP assessments

## 2019-12-18 NOTE — ED Notes (Signed)
[facility] given pt belongings, 2 pt belonging bags given to [facility], labeled with pt stickers

## 2019-12-18 NOTE — ED Notes (Signed)
BC X2, ALONG WITH PERITONEAL FLUID SAMPLES ALL OBTAINED AND TO THE LAB PRIOR TO ABX ADMINISTRATION

## 2019-12-18 NOTE — ED Notes (Signed)
States breathing and comfort have improved post abd fluid drainage performed per EDP orders

## 2019-12-18 NOTE — ED Notes (Signed)
States has CA, had tx last Tuesday, now having fatigue, feels dehydrated, states she has had a low grade fever. Now also has a cough.

## 2019-12-18 NOTE — ED Notes (Signed)
ED Provider at bedside. 

## 2019-12-18 NOTE — ED Notes (Signed)
Attempted to provide phone handoff report to rec RN at [facility], RN was unavailable at this time, asked to call back

## 2019-12-19 ENCOUNTER — Inpatient Hospital Stay (HOSPITAL_COMMUNITY): Payer: BC Managed Care – PPO

## 2019-12-19 DIAGNOSIS — C539 Malignant neoplasm of cervix uteri, unspecified: Secondary | ICD-10-CM | POA: Diagnosis present

## 2019-12-19 DIAGNOSIS — Z79899 Other long term (current) drug therapy: Secondary | ICD-10-CM | POA: Diagnosis not present

## 2019-12-19 DIAGNOSIS — E876 Hypokalemia: Secondary | ICD-10-CM | POA: Diagnosis not present

## 2019-12-19 DIAGNOSIS — D630 Anemia in neoplastic disease: Secondary | ICD-10-CM | POA: Diagnosis present

## 2019-12-19 DIAGNOSIS — Z515 Encounter for palliative care: Secondary | ICD-10-CM | POA: Diagnosis not present

## 2019-12-19 DIAGNOSIS — L03311 Cellulitis of abdominal wall: Secondary | ICD-10-CM | POA: Diagnosis present

## 2019-12-19 DIAGNOSIS — J189 Pneumonia, unspecified organism: Secondary | ICD-10-CM | POA: Diagnosis present

## 2019-12-19 DIAGNOSIS — E039 Hypothyroidism, unspecified: Secondary | ICD-10-CM

## 2019-12-19 DIAGNOSIS — E871 Hypo-osmolality and hyponatremia: Secondary | ICD-10-CM | POA: Diagnosis present

## 2019-12-19 DIAGNOSIS — C7951 Secondary malignant neoplasm of bone: Secondary | ICD-10-CM | POA: Diagnosis present

## 2019-12-19 DIAGNOSIS — Z888 Allergy status to other drugs, medicaments and biological substances status: Secondary | ICD-10-CM | POA: Diagnosis not present

## 2019-12-19 DIAGNOSIS — R180 Malignant ascites: Secondary | ICD-10-CM | POA: Diagnosis present

## 2019-12-19 DIAGNOSIS — Z8249 Family history of ischemic heart disease and other diseases of the circulatory system: Secondary | ICD-10-CM | POA: Diagnosis not present

## 2019-12-19 DIAGNOSIS — K219 Gastro-esophageal reflux disease without esophagitis: Secondary | ICD-10-CM | POA: Diagnosis present

## 2019-12-19 DIAGNOSIS — Z7189 Other specified counseling: Secondary | ICD-10-CM

## 2019-12-19 DIAGNOSIS — Z79890 Hormone replacement therapy (postmenopausal): Secondary | ICD-10-CM | POA: Diagnosis not present

## 2019-12-19 DIAGNOSIS — Z20822 Contact with and (suspected) exposure to covid-19: Secondary | ICD-10-CM | POA: Diagnosis present

## 2019-12-19 DIAGNOSIS — C786 Secondary malignant neoplasm of retroperitoneum and peritoneum: Secondary | ICD-10-CM | POA: Diagnosis present

## 2019-12-19 DIAGNOSIS — K659 Peritonitis, unspecified: Secondary | ICD-10-CM | POA: Diagnosis not present

## 2019-12-19 DIAGNOSIS — R531 Weakness: Secondary | ICD-10-CM | POA: Diagnosis present

## 2019-12-19 DIAGNOSIS — N739 Female pelvic inflammatory disease, unspecified: Secondary | ICD-10-CM | POA: Diagnosis present

## 2019-12-19 DIAGNOSIS — D849 Immunodeficiency, unspecified: Secondary | ICD-10-CM | POA: Diagnosis present

## 2019-12-19 DIAGNOSIS — Z882 Allergy status to sulfonamides status: Secondary | ICD-10-CM | POA: Diagnosis not present

## 2019-12-19 DIAGNOSIS — E872 Acidosis: Secondary | ICD-10-CM | POA: Diagnosis present

## 2019-12-19 DIAGNOSIS — Z66 Do not resuscitate: Secondary | ICD-10-CM | POA: Diagnosis present

## 2019-12-19 DIAGNOSIS — I10 Essential (primary) hypertension: Secondary | ICD-10-CM | POA: Diagnosis present

## 2019-12-19 DIAGNOSIS — K650 Generalized (acute) peritonitis: Secondary | ICD-10-CM | POA: Diagnosis present

## 2019-12-19 LAB — CBC WITH DIFFERENTIAL/PLATELET
Abs Immature Granulocytes: 0.14 10*3/uL — ABNORMAL HIGH (ref 0.00–0.07)
Basophils Absolute: 0 10*3/uL (ref 0.0–0.1)
Basophils Relative: 0 %
Eosinophils Absolute: 0.1 10*3/uL (ref 0.0–0.5)
Eosinophils Relative: 1 %
HCT: 26.2 % — ABNORMAL LOW (ref 36.0–46.0)
Hemoglobin: 8.1 g/dL — ABNORMAL LOW (ref 12.0–15.0)
Immature Granulocytes: 2 %
Lymphocytes Relative: 6 %
Lymphs Abs: 0.5 10*3/uL — ABNORMAL LOW (ref 0.7–4.0)
MCH: 27.7 pg (ref 26.0–34.0)
MCHC: 30.9 g/dL (ref 30.0–36.0)
MCV: 89.7 fL (ref 80.0–100.0)
Monocytes Absolute: 0.9 10*3/uL (ref 0.1–1.0)
Monocytes Relative: 10 %
Neutro Abs: 7.4 10*3/uL (ref 1.7–7.7)
Neutrophils Relative %: 81 %
Platelets: 508 10*3/uL — ABNORMAL HIGH (ref 150–400)
RBC: 2.92 MIL/uL — ABNORMAL LOW (ref 3.87–5.11)
RDW: 14.6 % (ref 11.5–15.5)
WBC: 9.1 10*3/uL (ref 4.0–10.5)
nRBC: 0 % (ref 0.0–0.2)

## 2019-12-19 LAB — COMPREHENSIVE METABOLIC PANEL
ALT: 23 U/L (ref 0–44)
AST: 30 U/L (ref 15–41)
Albumin: 1.9 g/dL — ABNORMAL LOW (ref 3.5–5.0)
Alkaline Phosphatase: 173 U/L — ABNORMAL HIGH (ref 38–126)
Anion gap: 11 (ref 5–15)
BUN: 10 mg/dL (ref 8–23)
CO2: 21 mmol/L — ABNORMAL LOW (ref 22–32)
Calcium: 7.6 mg/dL — ABNORMAL LOW (ref 8.9–10.3)
Chloride: 105 mmol/L (ref 98–111)
Creatinine, Ser: 0.74 mg/dL (ref 0.44–1.00)
GFR, Estimated: >60 mL/min (ref >60)
Glucose, Bld: 106 mg/dL — ABNORMAL HIGH (ref 70–99)
Potassium: 3.3 mmol/L — ABNORMAL LOW (ref 3.5–5.1)
Sodium: 137 mmol/L (ref 135–145)
Total Bilirubin: 0.4 mg/dL (ref 0.3–1.2)
Total Protein: 5.2 g/dL — ABNORMAL LOW (ref 6.5–8.1)

## 2019-12-19 LAB — LACTIC ACID, PLASMA: Lactic Acid, Venous: 1.3 mmol/L (ref 0.5–1.9)

## 2019-12-19 LAB — MAGNESIUM: Magnesium: 1.8 mg/dL (ref 1.7–2.4)

## 2019-12-19 LAB — HIV ANTIBODY (ROUTINE TESTING W REFLEX): HIV Screen 4th Generation wRfx: NONREACTIVE

## 2019-12-19 MED ORDER — ACETAMINOPHEN 325 MG PO TABS
650.0000 mg | ORAL_TABLET | Freq: Four times a day (QID) | ORAL | Status: DC | PRN
  Administered 2019-12-22: 650 mg via ORAL
  Filled 2019-12-19 (×2): qty 2

## 2019-12-19 MED ORDER — PANTOPRAZOLE SODIUM 40 MG PO TBEC
40.0000 mg | DELAYED_RELEASE_TABLET | Freq: Every day | ORAL | Status: DC
  Administered 2019-12-19 – 2019-12-24 (×5): 40 mg via ORAL
  Filled 2019-12-19 (×6): qty 1

## 2019-12-19 MED ORDER — PROCHLORPERAZINE EDISYLATE 10 MG/2ML IJ SOLN
10.0000 mg | Freq: Four times a day (QID) | INTRAMUSCULAR | Status: DC | PRN
  Administered 2019-12-19 – 2019-12-23 (×4): 10 mg via INTRAVENOUS
  Filled 2019-12-19 (×4): qty 2

## 2019-12-19 MED ORDER — ONDANSETRON HCL 4 MG/2ML IJ SOLN: 4.0000 mg | Freq: Four times a day (QID) | INTRAMUSCULAR | Status: DC | PRN

## 2019-12-19 MED ORDER — ACETAMINOPHEN 650 MG RE SUPP: 650.0000 mg | Freq: Four times a day (QID) | RECTAL | Status: DC | PRN

## 2019-12-19 MED ORDER — TIZANIDINE HCL 4 MG PO TABS
2.0000 mg | ORAL_TABLET | Freq: Three times a day (TID) | ORAL | Status: DC | PRN
  Administered 2019-12-23: 20:00:00 2 mg via ORAL
  Filled 2019-12-19: qty 1

## 2019-12-19 MED ORDER — GUAIFENESIN-CODEINE 100-10 MG/5ML PO SOLN
10.0000 mL | Freq: Four times a day (QID) | ORAL | Status: DC | PRN
  Administered 2019-12-19 (×2): 10 mL via ORAL
  Filled 2019-12-19 (×3): qty 10

## 2019-12-19 MED ORDER — GUAIFENESIN ER 600 MG PO TB12
600.0000 mg | ORAL_TABLET | Freq: Two times a day (BID) | ORAL | Status: DC
  Administered 2019-12-21 – 2019-12-24 (×6): 600 mg via ORAL
  Filled 2019-12-19 (×8): qty 1

## 2019-12-19 MED ORDER — MORPHINE SULFATE (PF) 2 MG/ML IV SOLN
2.0000 mg | INTRAVENOUS | Status: DC | PRN
  Administered 2019-12-19 – 2019-12-23 (×4): 2 mg via INTRAVENOUS
  Filled 2019-12-19 (×4): qty 1

## 2019-12-19 MED ORDER — TRAZODONE HCL 50 MG PO TABS: 50.0000 mg | ORAL_TABLET | Freq: Every evening | ORAL | Status: DC | PRN

## 2019-12-19 MED ORDER — LEVOTHYROXINE SODIUM 25 MCG PO TABS
125.0000 ug | ORAL_TABLET | Freq: Every day | ORAL | Status: DC
  Administered 2019-12-20 – 2019-12-24 (×5): 125 ug via ORAL
  Filled 2019-12-19 (×6): qty 1

## 2019-12-19 MED ORDER — BENZONATATE 100 MG PO CAPS
100.0000 mg | ORAL_CAPSULE | Freq: Three times a day (TID) | ORAL | Status: DC | PRN
  Administered 2019-12-19: 03:00:00 100 mg via ORAL
  Filled 2019-12-19: qty 1

## 2019-12-19 MED ORDER — POLYETHYLENE GLYCOL 3350 17 G PO PACK: 17.0000 g | PACK | Freq: Every day | ORAL | Status: DC | PRN

## 2019-12-19 NOTE — H&P (Signed)
History and Physical    Laura Woodward VOJ:[id number] DOB: 1955-11-12 DOA: 12/18/2019  PCP: Patient, No Pcp Per  Patient coming from: [facility]   Chief Complaint:  Chief Complaint  Patient presents with  . Weakness     HPI:    64 year old female with past medical history of metastatic cervical cancer to the bone with peritoneal carcinomatosis (follows with [facility] oncology), hypothyroidism, gastroesophageal reflux disease who presents to [facility] as a transfer from [employer] for progressive generalized weakness.  Of note, patient was recently hospitalized at [facility] from 11/19 until 11/25 after patient presented with significant ascites and was diagnosed with peritoneal carcinomatosis.  During that hospitalization a Pleurx tube was placed.  Patient also exhibited multiple SIRS criteria including fevers during the hospitalization concerning for underlying infection however work-up was negative and at time of discharge patient was felt to be exhibiting tumor fever.  Finally, patient's hospitalization was complicated by malignant ureteral obstruction and underwent left ureteral stent exchange on 10/25.  Since that hospitalization, patient has developed progressively worsening weakness.  Weakness has particularly become severe since her last round of chemotherapy (Topotecan 12/7).  Weakness is severe, worse with exertion and improved with rest.  Patient is also complaining of dry nonproductive cough without shortness of breath.  Patient denies dysuria, diarrhea, abdominal pain, sick contacts, recent travel or contact with confirmed COVID-19 infection.   Due to patient's progressively worsening symptoms patient eventually presented to [facility] oncology clinic on 12/14 where a peripheral IV was established and patient was administered intravenous potassium chloride and Toradol as well as having 1.1 L of fluid drained from her abdominal Pleurx tube.  Patient was discharged home with improved  symptoms.  Patient was also noted to have a possible abdominal wall erysipelas/cellulitis sent home on a course of Keflex.  Once again, patient symptoms continue to progressively worsen and patient eventually presented to [place] emergency department for evaluation.  Upon evaluation in the emergency department patient is been found to be quite tachycardic concerning for underlying infection.  CT imaging of the abdomen and pelvis revealed moderate amount of abdominal and pelvic ascites with diffuse peritoneal enhancement possibly secondary to metastatic disease or peritonitis.  Additionally, there was a 6.3 cm enhancing fluid collection in the deep posterior pelvis.  Pleurx tube was once again accessed draining off approximately 1 L of fluid and fluid was sent off for analysis.  Patient was initiated on broad-spectrum intravenous antibiotics.  Intention was to transfer patient to [facility] however no beds were available.  Case was discussed with Dr. Patsey Berthold patient's oncologist who recommended hospitalization and treatment of infection.  The hospitalist group was then called and the patient was accepted to [facility] long hospital for continued medical care.  Review of Systems:   Review of Systems  Constitutional: Positive for malaise/fatigue.  Neurological: Positive for weakness.  All other systems reviewed and are negative.   Past Medical History:  Diagnosis Date  . Cancer [facility])     Past Surgical History:  Procedure Laterality Date  . ABDOMINAL SURGERY    . TONSILLECTOMY       reports that she has never smoked. She has never used smokeless tobacco. She reports previous alcohol use. She reports that she does not use drugs.  Allergies  Allergen Reactions  . Paclitaxel Itching    Flushing, redness   . Sulfa Antibiotics     Leg swelling  . Ceftriaxone Nausea And Vomiting    Family History  Problem  Relation Age of Onset  . Heart disease Mother   . Heart disease Father       Prior to Admission medications   Medication Sig Start Date End Date Taking? Authorizing Provider  acetaminophen (TYLENOL) 500 MG tablet Take 1,000 mg by mouth daily as needed for moderate pain.    Yes [provider]  cephALEXin (KEFLEX) 500 MG capsule Take by mouth 4 (four) times daily. 12/15/19 12/22/19 Yes [provider]  levothyroxine (SYNTHROID) 125 MCG tablet Take 125 mcg by mouth daily. 12/11/19  Yes [provider]  naproxen (NAPROSYN) 250 MG tablet Take by mouth. 11/26/19 12/26/19 Yes [provider]  pantoprazole (PROTONIX) 40 MG tablet Take by mouth. 11/27/19 11/26/20 Yes [provider]  prochlorperazine (COMPAZINE) 10 MG tablet Take 10 mg by mouth every 8 (eight) hours as needed for nausea or vomiting.    Yes [provider]  tiZANidine (ZANAFLEX) 2 MG tablet Take 2 mg by mouth 2 (two) times daily. 11/30/19  Yes [provider]  dexamethasone (DECADRON) 4 MG tablet Take 4 mg by mouth daily.  Patient not taking: Reported on 12/19/2019 02/20/18   [provider]  gabapentin (NEURONTIN) 300 MG capsule Take 600 mg by mouth every 8 (eight) hours.  03/05/18 03/05/19  [provider]  hydrocortisone (ANUSOL-HC) 2.5 % rectal cream Place rectally 4 (four) times daily as needed for hemorrhoids or anal itching. Patient not taking: No sig reported 03/14/18   Pokhrel, Corrie Mckusick, MD  lidocaine (XYLOCAINE) 2 % jelly Apply 1 application topically 3 (three) times daily as needed (pain). Mix with hemorrhoidal cream and apply Patient not taking: Reported on 12/19/2019 03/14/18   Pokhrel, Corrie Mckusick, MD  Menthol-Methyl Salicylate (MUSCLE RUB) 10-15 % CREA Apply 1 application topically as needed for muscle pain. Patient not taking: Reported on 12/19/2019    [provider]  mirabegron ER (MYRBETRIQ) 50 MG TB24 tablet Take by mouth. Patient not taking: Reported on 12/19/2019 10/23/17   [provider]   nitrofurantoin, macrocrystal-monohydrate, (MACROBID) 100 MG capsule Take 100 mg by mouth every 12 (twelve) hours. Patient not taking: Reported on 12/19/2019 03/04/18   [provider]  oxybutynin (DITROPAN-XL) 5 MG 24 hr tablet Take 5 mg by mouth at bedtime.  Patient not taking: Reported on 12/19/2019 12/30/17   [provider]  potassium chloride 20 MEQ TBCR Take 20 mEq by mouth daily for 10 days. 03/14/18 03/24/18  Pokhrel, Corrie Mckusick, MD  traZODone (DESYREL) 50 MG tablet Take 50 mg by mouth at bedtime as needed for sleep.  02/20/18 02/20/19  [provider]  trospium (SANCTURA) 20 MG tablet Take 20 mg by mouth.  Patient not taking: Reported on 12/19/2019 01/08/18   [provider]    Physical Exam: Vitals:   12/18/19 2154 12/19/19 0030 12/19/19 0222 12/19/19 0401  BP: 125/70 (!) 142/87 133/66 111/65  Pulse: (!) 118 (!) 115 (!) 112 (!) 109  Resp: (!) 26 18    Temp:  99.9 F (37.7 C) 98.6 F (37 C)   TempSrc:  Oral Oral   SpO2: 96% 96% 94% 97%  Weight:  69 kg    Height:  5' 6.5" (1.689 m)      Constitutional: Patient is lethargic but arousable and oriented x3, patient is not in associated distress.   Skin: Notable area of hyperemia, warmth and induration emanating from site of Pleurx tube dressing, extending to the lower back.   increased skin pallor noted, somewhat poor skin turgor noted. Eyes:  Pupils are equally reactive to light.  Notable increased conjunctival pallor without scleral icterus. ENMT: Dry mucous membranes noted.  Posterior pharynx clear of any exudate or lesions.   Neck: normal, supple, no masses, no thyromegaly.  No evidence of jugular venous distension.   Respiratory: Somewhat diminished breath sounds at the bases with mild bibasilar rales.  No evidence of wheezing.  Patient is somewhat tachypneic without evidence of accessory muscle use.  Cardiovascular: Tachycardic rate with regular rhythm no murmurs / rubs / gallops. No extremity edema.  2+ pedal pulses. No carotid bruits.  Chest:   Nontender without crepitus or deformity.   Back:   Nontender without crepitus or deformity. Abdomen: Abdomen is notably protuberant but soft and nontender.  Notable lower abdominal fullness in particular on palpation.  Right anterior abdominal wall dressing is in place and is clean dry and intact.    Positive bowel sounds noted in all quadrants.   Musculoskeletal: No joint deformity upper and lower extremities. Good ROM, no contractures. Normal muscle tone.  Neurologic: CN 2-12 grossly intact. Sensation intact.  Patient moving all 4 extremities spontaneously.  Patient is following all commands.  Patient is responsive to verbal stimuli.   Psychiatric: Patient exhibits depressed mood with flat affect.  Patient seems to possess insight as to their current situation.     Labs on Admission: I have personally reviewed following labs and imaging studies -   CBC: Recent Labs  Lab 12/18/19 1455 12/19/19 0551  WBC 10.4 9.1  NEUTROABS 8.3* 7.4  HGB 9.2* 8.1*  HCT 28.9* 26.2*  MCV 87.8 89.7  PLT 528* 431*   Basic Metabolic Panel: Recent Labs  Lab 12/18/19 1455  NA 134*  K 3.5  CL 100  CO2 21*  GLUCOSE 123*  BUN 11  CREATININE 0.79  CALCIUM 8.6*   GFR: Estimated Creatinine Clearance: 69.6 mL/min (by C-G formula based on SCr of 0.79 mg/dL). Liver Function Tests: Recent Labs  Lab 12/18/19 1455  AST 43*  ALT 28  ALKPHOS 197*  BILITOT 0.4  PROT 6.0*  ALBUMIN 2.3*   No results for input(s): LIPASE, AMYLASE in the last 168 hours. No results for input(s): AMMONIA in the last 168 hours. Coagulation Profile: Recent Labs  Lab 12/18/19 1455  INR 1.0   Cardiac Enzymes: No results for input(s): CKTOTAL, CKMB, CKMBINDEX, TROPONINI in the last 168 hours. BNP (last 3 results) No results for input(s): PROBNP in the last 8760 hours. HbA1C: No results for input(s): HGBA1C in the last 72 hours. CBG: No results for input(s): GLUCAP in the  last 168 hours. Lipid Profile: No results for input(s): CHOL, HDL, LDLCALC, TRIG, CHOLHDL, LDLDIRECT in the last 72 hours. Thyroid Function Tests: No results for input(s): TSH, T4TOTAL, FREET4, T3FREE, THYROIDAB in the last 72 hours. Anemia Panel: No results for input(s): VITAMINB12, FOLATE, FERRITIN, TIBC, IRON, RETICCTPCT in the last 72 hours. Urine analysis:    Component Value Date/Time   COLORURINE YELLOW 12/18/2019 2030   [place] 12/18/2019 2030   LABSPEC <1.005 (L) 12/18/2019 2030   PHURINE 5.0 12/18/2019 2030   GLUCOSEU NEGATIVE 12/18/2019 2030   [place] (A) 12/18/2019 2030   [place] NEGATIVE 12/18/2019 2030   [place] 12/18/2019 2030   PROTEINUR NEGATIVE 12/18/2019 2030   NITRITE POSITIVE (A) 12/18/2019 2030   LEUKOCYTESUR NEGATIVE 12/18/2019 2030    Radiological Exams on Admission - Personally Reviewed: DG Chest 1 View  Result Date: 12/19/2019 CLINICAL DATA:  Pulmonary edema. EXAM: CHEST  1 VIEW  COMPARISON:  12/18/2019 FINDINGS: Heart size remains within normal limits. Low lung volumes are again seen. Bibasilar infiltrates are again seen, with probable mild worsening in the left lung base when allowing for changes in positioning. Tiny pleural effusions cannot be excluded. IMPRESSION: Bibasilar infiltrates, with probable mild worsening in the left lung base. Electronically Signed   By: Marlaine Hind M.D.   On: 12/19/2019 05:01   CT ABDOMEN PELVIS W CONTRAST  Result Date: 12/18/2019 CLINICAL DATA:  Abdomen distension history of cervical cancer EXAM: CT ABDOMEN AND PELVIS WITH CONTRAST TECHNIQUE: Multidetector CT imaging of the abdomen and pelvis was performed using the standard protocol following bolus administration of intravenous contrast. CONTRAST:  13mL OMNIPAQUE IOHEXOL 300 MG/ML  SOLN COMPARISON:  None. FINDINGS: Lower chest: Lung bases demonstrate small bilateral pleural effusions and dependent lower lobe atelectasis. Borderline  cardiomegaly. Small hiatal hernia. Hepatobiliary: No focal hepatic abnormality. No calcified gallstone or biliary dilatation Pancreas: Unremarkable. No pancreatic ductal dilatation or surrounding inflammatory changes. Spleen: Normal in size without focal abnormality. Adrenals/Urinary Tract: Adrenal glands are normal. Ureteral stent within the left kidney. There is mild bilateral hydronephrosis. Abnormal appearance of the bladder with irregular areas of wall thickening. Masslike thickening of the posterior wall of the bladder with soft tissue density near the left distal pigtail. Stomach/Bowel: The stomach is nonenlarged. Fluid-filled nondistended small bowel. No acute colon wall thickening. Vascular/Lymphatic: Nonaneurysmal aorta. Subcentimeter Peri aortic lymph nodes. Mildly enlarged bilateral nodes adjacent to the proximal external iliac arteries. On the right, this measures 11 mm and on the left, measures 14 mm. Reproductive: Fluid-filled structure within the central pelvis which appears contiguous with the vagina and presumably represents an obstructed uterus. Fluid visible within the vaginal cuff. Irregular soft tissue density in the region of the cervix which appears contiguous with irregular soft tissue density at the posterior aspect of the bladder concerning for bladder invasion. Possible dilated right fallopian tube. Other: No free air. Abdominal drainage catheter in the right lower quadrant with the tip terminating in the left mid abdomen. Moderate abdominal ascites and pelvic ascites. Nodular infiltration of the left upper quadrant mesentery and the omentum, consistent with peritoneal metastatic disease. Mild diffuse peritoneal enhancement. Rim enhancing loculated fluid collections within the pelvis. The largest collection is seen posteriorly and measures 6.3 x 3.2 cm. Musculoskeletal: Sclerotic lesions within the left iliac bone, right sacrum and left ischium, concerning for metastatic disease.  IMPRESSION: 1. Moderate abdominal and pelvic ascites with peritoneal drainage catheter in place. Mild diffuse peritoneal enhancement could be secondary to metastatic disease or peritonitis. Loculated/mildly rim enhancing fluid collection within the deep posterior pelvis measuring 6.3 cm. 2. Irregular soft tissue density in the region of the cervix, appears contiguous with lobulated soft tissue density at the posterior wall of the bladder concerning for invasion. There are other areas of irregular bladder wall thickening, concerning for neoplastic involvement. Nodular infiltration of the omentum and left upper quadrant mesentery consistent with peritoneal metastatic disease. Left ureteral stent in place. There is mild bilateral hydronephrosis, suspect secondary to mild obstruction from soft tissue mass at the posterior aspect of the bladder. 3. Fluid-filled structure within the central pelvis, contiguous with fluid-filled vagina presumably represents an obstructed uterus. 4. Small bilateral pleural effusions. 5. Metastatic disease of the pelvic bones. Enlarged pelvic lymph nodes consistent with metastatic disease Electronically Signed   By: Donavan Foil M.D.   On: 12/18/2019 18:01   DG Chest Portable 1 View  Result Date: 12/18/2019 CLINICAL DATA:  Chest pain,  cough and weakness. EXAM: PORTABLE CHEST 1 VIEW COMPARISON:  03/10/2018 FINDINGS: Poor inspiration. Hazy/patchy infiltrates in the lower lungs. Small pleural effusion on the right. Findings consistent with bibasilar pneumonia. No dense consolidation or lobar collapse. No significant bone finding. IMPRESSION: Hazy/patchy infiltrates in the lower lungs consistent with bibasilar pneumonia. Small right effusion. Electronically Signed   By: Nelson Chimes M.D.   On: 12/18/2019 16:00    EKG: Personally reviewed.  Rhythm is sinus tachycardia with heart rate of 115 bpm.  No dynamic ST segment changes appreciated.  Assessment/Plan Principal Problem:   Acute  bacterial peritonitis [facility])   Patient presenting with progressively worsening generalized weakness and rapidly recurring malignant ascites in the setting of recent Pleurx tube placement in late November at [place] fluid obtained at [facility] reveals markedly elevated neutrophils concerning for bacterial peritonitis despite negative Gram stain.  Additionally, CT imaging of the abdomen does reveal area concerning for abscess formation in the deep pelvis.  ER provider did discuss case with Dr. Anselm Pancoast with eventual radiology who stated that they would see the patient this morning to evaluate options for drainage of this potential abscess.  Patient is currently on broad-spectrum intravenous antibiotics considering patient's profoundly immunocompromise state.  Current regimen was initiated by the emergency department, will continue this for now.  Hydrating patient with isotonic fluids  Blood cultures and peritoneal cultures pending  Patient would likely benefit from infectious disease consultation  Active Problems:   Peritoneal carcinomatosis [facility])   Recent diagnosis at [facility] in late November status post Pleurx tube placement  I have placed Pleurx tube drainage orders for regular drainage daily  Presence of peritoneal carcinomatosis and recurrent bouts of malignant ascites is a sign of patient's poor prognosis.    Pelvic abscess in female   Please see assessment and plan above    Cellulitis of abdominal wall   Noted abdominal wall erysipelas/cellulitis and [facility] oncology note earlier in the month.  Patient presents with progressively worsening evidence of abdominal wall cellulitis on this admission  Treating with broad-spectrum intravenous antibiotics for possible peritonitis in addition to this  Monitoring for symptomatic improvement    Squamous cell carcinoma of cervix ([place])   Metastatic disease to bone with peritoneal carcinomatosis  Patient follows  with [facility] oncology however had to be admitted here due to lack of beds at [facility]  Case discussed with Dr. Patsey Berthold, patient's oncologist while patient was at [facility] who recommended hospitalization and treatment of underlying infection.    Malignant ascites   Please see assessment and plan above    Goals of care, counseling/discussion   Considering metastatic cervical cancer with peritoneal carcinomatosis and recurrent hospitalizations patient's overall prognosis is extremely poor.  I have discussed patient's prognosis with the patient.  Patient has considered hospice as of late but has not agreed for to be initiated at this point.  We will place palliative care consultation to continue this dialogue.  Patient has confirmed that she is DNR.    Hypothyroidism (acquired)   Continue home regimen of Synthroid    GERD without esophagitis    Continue home regimen of PPI   Code Status:  DNR Family Communication: deferred   Status is: Inpatient  Remains inpatient appropriate because:IV treatments appropriate due to intensity of illness or inability to take PO and Inpatient level of care appropriate due to severity of illness   Dispo: The patient is from: Home  Anticipated d/c is to: Home              Anticipated d/c date is: > 3 days              Patient currently is not medically stable to d/c.        Vernelle Emerald MD [facility] Pager [phone]  If 7PM-7AM, please contact night-coverage [email or web address] Use universal [place] password for that web site. If you do not have the password, please call the hospital operator.  12/19/2019, 6:33 AM

## 2019-12-19 NOTE — Progress Notes (Signed)
   12/19/19 0040  Assess: MEWS Score  Level of Consciousness Alert  Assess: MEWS Score  MEWS Temp 0  MEWS Systolic 0  MEWS Pulse 2  MEWS RR 0  MEWS LOC 0  MEWS Score 2  MEWS Score Color Yellow  Assess: if the MEWS score is Yellow or Red  Were vital signs taken at a resting state? Yes  Focused Assessment No change from prior assessment  Early Detection of Sepsis Score *See Row Information* Low  MEWS guidelines implemented *See Row Information* Yes  Treat  MEWS Interventions Escalated (See documentation below)  Pain Scale 0-10  Pain Score 0  Escalate  MEWS: Escalate Yellow: discuss with charge nurse/RN and consider discussing with provider and RRT  Notify: Charge Nurse/RN  Name of Charge Nurse/RN Notified Joann, RN  Date Charge Nurse/RN Notified 12/18/19  Time Charge Nurse/RN Notified 0040

## 2019-12-19 NOTE — Plan of Care (Signed)

## 2019-12-19 NOTE — Progress Notes (Signed)
   12/19/19 0030  Assess: MEWS Score  Temp 99.9 F (37.7 C)  BP (!) 142/87  Pulse Rate (!) 115  Resp 18  SpO2 96 %  O2 Device Room Air  Assess: MEWS Score  MEWS Temp 0  MEWS Systolic 0  MEWS Pulse 2  MEWS RR 0  MEWS LOC 0  MEWS Score 2  MEWS Score Color Yellow  Assess: if the MEWS score is Yellow or Red  Were vital signs taken at a resting state? Yes  Focused Assessment No change from prior assessment  Early Detection of Sepsis Score *See Row Information* Low  MEWS guidelines implemented *See Row Information* Yes  Treat  MEWS Interventions Escalated (See documentation below)  Pain Scale 0-10  Pain Score 0  Escalate  MEWS: Escalate Yellow: discuss with charge nurse/RN and consider discussing with provider and RRT  Notify: Charge Nurse/RN  Name of Charge Nurse/RN Notified Joann, RN  Date Charge Nurse/RN Notified 12/18/19  Time Charge Nurse/RN Notified 0040

## 2019-12-19 NOTE — Progress Notes (Signed)
Same day note  Patient seen and examined at bedside.  Patient was admitted to the hospital for weakness, progressive in nature  At the time of my evaluation, patient complains of nausea but no overt abdominal pain.  Physical examination reveals female with Pleurx catheter in the abdomen.  No gross rebound tenderness but distention noted.  Laboratory data and imaging was reviewed  Assessment and Plan.  Acute bacterial peritonitis with malignant ascites. Status post peritoneal catheter. Peritoneal fluid analysis showed bacterial peritonitis. The scan of abdomen showed findings concerning for abscess in the deep pelvis. Interventional radiology Dr. Anselm Pancoast has been consulted for possible intervention. Continue IV antibiotics due to immunocompromise status. Follow blood cultures, peritoneal fluid cultures.    Peritoneal carcinomatosis  Recent diagnosis at [facility] in late November status post Pleurx tube placement. Poor prognosis.    Pelvic abscess in female. Continue IV antibiotic. IR consulted for possible drainage    Cellulitis of abdominal wall Patient presents with progressively worsening evidence of abdominal wall cellulitis on this admission. Continue IV antibiotic.   Squamous cell carcinoma of cervix with bone metastasis and peritoneal carcinomatosis Patient follows with [facility] oncology however had to be admitted here due to lack of beds at [facility]. Case pain discussed with Dr. Patsey Berthold, patient's oncologist while patient was at [facility] who recommended hospitalization and treatment of underlying infection.    Hypothyroidism  Continue Synthroid    GERD without esophagitis Continue PPI  Goals of care.Considering metastatic cervical cancer with peritoneal carcinomatosis and recurrent hospitalizations, patient's overall prognosis is extremely poor. As per the admitting provider, patient has considered hospice as of late but has not agreed for to be initiated at this point.  Consult palliative care. Patient is DO NOT RESUSCITATE  No Charge  Signed,  Delila Pereyra, MD [facility]

## 2019-12-20 MED ORDER — OXYCODONE-ACETAMINOPHEN 5-325 MG PO TABS
1.0000 | ORAL_TABLET | Freq: Four times a day (QID) | ORAL | Status: DC | PRN
  Administered 2019-12-20 – 2019-12-22 (×6): 1 via ORAL
  Filled 2019-12-20 (×7): qty 1

## 2019-12-20 MED ORDER — POTASSIUM CHLORIDE CRYS ER 20 MEQ PO TBCR
40.0000 meq | EXTENDED_RELEASE_TABLET | Freq: Once | ORAL | Status: AC
  Administered 2019-12-20: 09:00:00 40 meq via ORAL
  Filled 2019-12-20: qty 2

## 2019-12-20 NOTE — Plan of Care (Signed)
  Problem: Activity: Goal: Risk for activity intolerance will decrease Outcome: Progressing   Problem: Nutrition: Goal: Adequate nutrition will be maintained Outcome: Progressing   Problem: Education: Goal: Knowledge of General Education information will improve Description: Including pain rating scale, medication(s)/side effects and non-pharmacologic comfort measures Outcome: Completed/Met   Problem: Health Behavior/Discharge Planning: Goal: Ability to manage health-related needs will improve Outcome: Completed/Met   Problem: Coping: Goal: Level of anxiety will decrease Outcome: Completed/Met

## 2019-12-20 NOTE — Consult Note (Signed)
Consultation Note Date: 12/20/2019   Patient Name: Laura Woodward  DOB: 07-14-1955  MRN: [id number]  Age / Sex: 64 y.o., female  PCP: Patient, No Pcp Per Referring Physician: Flora Lipps, MD  Reason for Consultation: Establishing goals of care  HPI/Patient Profile: 64 y.o. female  with past medical history of metastatic cervical cancer to the bone with peritoneal carcinomatosis who follows at [facility] and recently had Pleurx placed, hypothyroidism, GERD admitted on 12/18/2019 after presenting to the ED with generalized weakness.  Weakness has been progressive and got particularly worse after last round of chemotherapy (topotecan on 12/7).  She was seen by [facility] oncology clinic on 12/14 where she was given fluids and had Pleurx tube drained.  She was noted to have possible cellulitis and sent home with Keflex.  Care everywhere review reveals that she had also been referred to home hospice at that time.  After presentation to the ED, she is currently being treated for SBP and also has CT of the abdomen concerning for abscess in the deep pelvis.  Interventional radiology is going to evaluate for potential drainage.  She is currently on broad-spectrum antibiotics.  Palliative consulted for goals of care.  Clinical Assessment and Goals of Care: Palliative care consult received.  Chart reviewed including personal review of pertinent labs and imaging.  I met today with [name].  She reports that the things most important to her is her dog, whom unfortunately she has had to have other people watched due to her illness.  She has many friends and some family in the area and relies mostly on a friend, Elpidio Eric, who is also her healthcare power of attorney.  She is currently living in a condo/townhouse in [place] but also has a place in [place].  She spends most of her day watching TV as she has trouble getting up and  about due to progression of her illness.  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  Her spirituality is important to her.  She does not attend in person regularly but watches sermons on TV.  She reports she grew up in a small community with a very devout small church that she just cannot find replicated anywhere else.  We discussed clinical course as well as wishes moving forward in regard to advanced directives.  Concepts specific to code status and rehospitalization discussed.  We discussed difference between a aggressive medical intervention path and a palliative, comfort focused care path.  Values and goals of care important to patient and family were attempted to be elicited.  Concept of Hospice and Palliative Care were discussed.  She reports being familiar with hospice services and actually had a referral placed for her for community hospice from her oncologist's office at [facility].  She has been speaking with them via phone but they have not had the opportunity to officially enroll her in the program.  In talking with her today, it appears she would  be open to this consideration at time of discharge.  Questions and concerns addressed.   PMT will continue to support holistically.  SUMMARY OF RECOMMENDATIONS   -DNR/DNI -Her friend, Elpidio Eric, is her healthcare power of attorney. -She would like to continue with current interventions in the hope of treating this acute infection.  At the same time, she understands her overall situation and we discussed consideration for hospice support at time of discharge.  Overall, I think plan will be to treat treatable conditions this admission and discharge with hospice whenever she is medically maximized. -She has been speaking with [facility] about enrolling in hospice services.  A referral was placed by [facility] to [facility] in [place] but she has not yet been enrolled in services.   She is not opposed to hospice services and is thinking that this will be the plan when she discharges from her hospital.  She has been going back and forth between [place] and [facility] and will need to ensure wherever she goes on discharge that her preferred hospice agency covers that area. -We will plan to follow-up tomorrow and she would like for Sonya to be part of conversation via phone at that time.  Code Status/Advance Care Planning:  DNR    Prognosis:   < 6 months if her disease follows its natural course and she should qualify for hospice services if so desired  Discharge Planning: To Be Determined-likely home with hospice     Primary Diagnoses: Present on Admission: . Squamous cell carcinoma of cervix ([place]) . Peritoneal carcinomatosis ([place]) . Malignant ascites . Acute bacterial peritonitis ([place]) . Pelvic abscess in female . Cellulitis of abdominal wall . Hypothyroidism (acquired) . GERD without esophagitis   I have reviewed the medical record, interviewed the patient and family, and examined the patient. The following aspects are pertinent.  Past Medical History:  Diagnosis Date  . Cancer [facility])    Social History   Socioeconomic History  . Marital status: Single    Spouse name: Not on file  . Number of children: Not on file  . Years of education: Not on file  . Highest education level: Not on file  Occupational History  . Not on file  Tobacco Use  . Smoking status: Never Smoker  . Smokeless tobacco: Never Used  Substance and Sexual Activity  . Alcohol use: Not Currently  . Drug use: Never  . Sexual activity: Not on file  Other Topics Concern  . Not on file  Social History Narrative  . Not on file   Social Determinants of Health   Financial Resource Strain: Not on file  Food Insecurity: Not on file  Transportation Needs: Not on file  Physical Activity: Not on file  Stress: Not on file  Social Connections: Not on file   Family History  Problem  Relation Age of Onset  . Heart disease Mother   . Heart disease Father    Scheduled Meds: . guaiFENesin  600 mg Oral BID  . levothyroxine  125 mcg Oral Q0600  . metroNIDAZOLE  500 mg Oral Q8H  . pantoprazole  40 mg Oral Daily   Continuous Infusions: . aztreonam 2 g (12/20/19 0120)  . vancomycin     PRN Meds:.acetaminophen **OR** acetaminophen, guaiFENesin-codeine, morphine injection, oxyCODONE-acetaminophen, polyethylene glycol, prochlorperazine, tiZANidine, traZODone Medications Prior to Admission:  Prior to Admission medications   Medication Sig Start Date End Date Taking? Authorizing Provider  acetaminophen (TYLENOL) 500 MG tablet Take 1,000 mg by mouth daily  as needed for moderate pain.    Yes [provider]  cephALEXin (KEFLEX) 500 MG capsule Take by mouth 4 (four) times daily. 12/15/19 12/22/19 Yes [provider]  levothyroxine (SYNTHROID) 125 MCG tablet Take 125 mcg by mouth daily. 12/11/19  Yes [provider]  naproxen (NAPROSYN) 250 MG tablet Take by mouth. 11/26/19 12/26/19 Yes [provider]  pantoprazole (PROTONIX) 40 MG tablet Take by mouth. 11/27/19 11/26/20 Yes [provider]  prochlorperazine (COMPAZINE) 10 MG tablet Take 10 mg by mouth every 8 (eight) hours as needed for nausea or vomiting.    Yes [provider]  tiZANidine (ZANAFLEX) 2 MG tablet Take 2 mg by mouth 2 (two) times daily. 11/30/19  Yes [provider]  dexamethasone (DECADRON) 4 MG tablet Take 4 mg by mouth daily.  Patient not taking: Reported on 12/19/2019 02/20/18   [provider]  gabapentin (NEURONTIN) 300 MG capsule Take 600 mg by mouth every 8 (eight) hours.  03/05/18 03/05/19  [provider]  hydrocortisone (ANUSOL-HC) 2.5 % rectal cream Place rectally 4 (four) times daily as needed for hemorrhoids or anal itching. Patient not taking: No sig reported 03/14/18   Pokhrel, Corrie Mckusick, MD  lidocaine (XYLOCAINE) 2 % jelly  Apply 1 application topically 3 (three) times daily as needed (pain). Mix with hemorrhoidal cream and apply Patient not taking: Reported on 12/19/2019 03/14/18   Pokhrel, Corrie Mckusick, MD  Menthol-Methyl Salicylate (MUSCLE RUB) 10-15 % CREA Apply 1 application topically as needed for muscle pain. Patient not taking: Reported on 12/19/2019    [provider]  mirabegron ER (MYRBETRIQ) 50 MG TB24 tablet Take by mouth. Patient not taking: Reported on 12/19/2019 10/23/17   [provider]  nitrofurantoin, macrocrystal-monohydrate, (MACROBID) 100 MG capsule Take 100 mg by mouth every 12 (twelve) hours. Patient not taking: Reported on 12/19/2019 03/04/18   [provider]  oxybutynin (DITROPAN-XL) 5 MG 24 hr tablet Take 5 mg by mouth at bedtime.  Patient not taking: Reported on 12/19/2019 12/30/17   [provider]  potassium chloride 20 MEQ TBCR Take 20 mEq by mouth daily for 10 days. 03/14/18 03/24/18  Pokhrel, Corrie Mckusick, MD  traZODone (DESYREL) 50 MG tablet Take 50 mg by mouth at bedtime as needed for sleep.  02/20/18 02/20/19  [provider]  trospium (SANCTURA) 20 MG tablet Take 20 mg by mouth.  Patient not taking: Reported on 12/19/2019 01/08/18   [provider]   Allergies  Allergen Reactions  . Paclitaxel Itching    Flushing, redness   . Sulfa Antibiotics     Leg swelling  . Ceftriaxone Nausea And Vomiting   Review of Systems  Constitutional: Positive for fatigue.  Gastrointestinal: Positive for abdominal distention.  Neurological: Positive for weakness.   Physical Exam General: Alert, awake, in no acute distress.  Heart: Regular rate and rhythm. No murmur appreciated. Lungs: Good air movement, clear Abdomen:  Nondistended, Pleurx in place, positive bowel sounds.  Ext: No significant edema Skin: Warm and dry Neuro: Grossly intact, nonfocal.  Vital Signs: BP 121/77 (BP Location: Left Arm)   Pulse (!) 104   Temp 99 F (37.2 C) (Oral)    Resp 16   Ht 5' 6.5" (1.689 m)   Wt 69 kg   SpO2 96%   BMI 24.18 kg/m  Pain Scale: 0-10   Pain Score: 3    SpO2: SpO2: 96 % O2 Device:SpO2: 96 % O2 Flow Rate: .   IO: Intake/output summary:   Intake/Output  Summary (Last 24 hours) at 12/20/2019 0941 Last data filed at 12/20/2019 6431 Gross per 24 hour  Intake 660 ml  Output 1600 ml  Net -940 ml    LBM: Last BM Date: 12/19/19 Baseline Weight: Weight: 71.7 kg Most recent weight: Weight: 69 kg     Palliative Assessment/Data:     Time In: 1730 Time Out: 1845 Time Total: 75 Greater than 50%  of this time was spent counseling and coordinating care related to the above assessment and plan.  Signed by: Micheline Rough, MD   Please contact Palliative Medicine Team phone at [phone] for questions and concerns.  For individual provider: See Shea Evans

## 2019-12-20 NOTE — Progress Notes (Signed)
Drained pleurx drain.  Obtained slightly greater than 1L of yellow colored fluid. Pt tolerated procedure well. Itai Barbian, Laurel Dimmer, RN

## 2019-12-20 NOTE — Progress Notes (Addendum)
PROGRESS NOTE  Laura Woodward ION:[id number] DOB: December 26, 1955 DOA: 12/18/2019 PCP: Patient, No Pcp Per   LOS: 1 day   Brief narrative: As per HPI,  64 year old female with past medical history of metastatic cervical cancer to the bone with peritoneal carcinomatosis (follows with [facility] oncology), hypothyroidism, gastroesophageal reflux disease presented to the hospital with progressive generalized weakness.  Of note patient was recently hospitalized at [facility] from 11/19 until 11/25 after patient presented with significant ascites and was diagnosed with peritoneal carcinomatosis.  During that hospitalization a Pleurx tube was placed.  Work-up for infection was negative at that time and was thought to be tumor induced fever.  She also had malignant ureteral obstruction and stent was placed on October 25.  Since that hospitalization patient has been having worsening weakness.  She did receive therapy as outpatient.  On 12/15/2019 patient presented to the urology clinic with progressive weakness.    She was also noted to have a possible abdominal wall erysipelas/cellulitis sent home on a course of Keflex. Once again, patient symptoms continue to progressively worsen and patient eventually presented to [place] emergency department for evaluation.  In the ED this time patient was tachycardic. CT scan of the abdomen pelvis showed moderate amount of abdominal and pelvic ascites with diffuse peritoneal enhancement possibly secondary to metastatic disease or peritonitis.  Additionally, there was a 6.3 cm enhancing fluid collection in the deep posterior pelvis.  Pleurx tube was once again accessed draining off approximately 1 L of fluid and fluid was sent off for analysis.  Patient was initiated on broad-spectrum intravenous antibiotics.  During provider had discussed case was discussed with Dr. Patsey Berthold patient's oncologist who recommended hospitalization and treatment of infection.    Was  then admitted to hospital for further evaluation and treatment.    Assessment/Plan:  Principal Problem:   Acute bacterial peritonitis ([place]) Active Problems:   Squamous cell carcinoma of cervix (HCC)   Peritoneal carcinomatosis (HCC)   Malignant ascites   Pelvic abscess in female   Cellulitis of abdominal wall   Goals of care, counseling/discussion   Hypothyroidism (acquired)   GERD without esophagitis   Acute bacterial peritonitis with malignant ascites. Status post peritoneal catheter. Peritoneal fluid analysis showed bacterial peritonitis.  CT scan scan of abdomen showed findings concerning for abscess in the deep pelvis. Interventional radiology Dr.Henn has been consulted for possible intervention but upon review recommendation is continuing antibiotic.Marland Kitchen Continue IV antibiotics due to immunocompromised status. Follow blood cultures, peritoneal fluid cultures.  Still pending.  Renal fluid Gram stain did not show any organisms  Hypokalemia.  Will replenish orally.  Peritoneal carcinomatosis  Recent diagnosis at [facility] in late November status post Pleurx tube placement. Poor prognosis.  Suspected pelvic abscess abscess in female. Continue IV antibiotic. IR consulted for possible drainage but the body for now no intervention planned.   Cellulitis of abdominal wall Patient presents with progressively worsening evidence of abdominal wall cellulitis. Continue IV antibiotic.  Squamous cell carcinoma of cervix with bone metastasis and peritoneal carcinomatosis Patient follows with [facility] oncology however had to be admitted here due to lack of beds at [facility]  Admitting provider had discussed with Dr. Patsey Berthold, patient's oncologist while patient was at [facility] who recommended hospitalization and treatment of underlying infection.  Hypothyroidism  Continue Synthroid  GERD without esophagitis Continue PPI  Goals of care.  Palliative care has been consulted.   Patient is willing to discuss about hospice care on discharge  DVT prophylaxis: SCDs Start: 12/19/19 0406  Code Status: DNR  Family Communication: None  Status is: Inpatient  Remains inpatient appropriate because:IV treatments appropriate due to intensity of illness or inability to take PO, Inpatient level of care appropriate due to severity of illness and Palliative care consultation, possible hospice   Dispo: The patient is from: Home              Anticipated d/c is to: Home              Anticipated d/c date is: 2 days              Patient currently is not medically stable to d/c.   Consultants:  Interventional radiology  Procedures:  Peritoneal fluid analysis  Antibiotics:  . Vancomycin azactam and Flagyl 12/17>  Anti-infectives (From admission, onward)   Start     Dose/Rate Route Frequency Ordered Stop   12/19/19 1700  vancomycin (VANCOREADY) IVPB 1500 mg/300 mL        1,500 mg 150 mL/hr over 120 Minutes Intravenous Every 24 hours 12/18/19 1543     12/19/19 0000  aztreonam (AZACTAM) 2 g in sodium chloride 0.9 % 100 mL IVPB        2 g 200 mL/hr over 30 Minutes Intravenous Every 8 hours 12/18/19 1543     12/18/19 1700  vancomycin (VANCOCIN) 500 mg in sodium chloride 0.9 % 100 mL IVPB        500 mg 100 mL/hr over 60 Minutes Intravenous  Once 12/18/19 1543 12/18/19 2059   12/18/19 1548  vancomycin (VANCOCIN) 500 MG powder       Note to Pharmacy: Sandra Cockayne   : cabinet override      12/18/19 1548 12/19/19 0359   12/18/19 1529  aztreonam (AZACTAM) 2 g injection       Note to Pharmacy: Sandra Cockayne   : cabinet override      12/18/19 1529 12/19/19 0344   12/18/19 1515  aztreonam (AZACTAM) 2 g in sodium chloride 0.9 % 100 mL IVPB        2 g 200 mL/hr over 30 Minutes Intravenous  Once 12/18/19 1510 12/18/19 1700   12/18/19 1515  vancomycin (VANCOCIN) IVPB 1000 mg/200 mL premix        1,000 mg 200 mL/hr over 60 Minutes Intravenous  Once 12/18/19 1510 12/18/19 1956    12/18/19 1515  metroNIDAZOLE (FLAGYL) tablet 500 mg        500 mg Oral Every 8 hours 12/18/19 1510         Subjective: Today, patient was seen and examined at bedside.  Patient denies pain but has some nausea.  Complains of mild back pain at the site of erythema.  Has been having bowel movement.  No fever or chills.  Objective: Vitals:   12/20/19 0112 12/20/19 0609  BP: 119/69 121/77  Pulse: (!) 115 (!) 104  Resp: 20 16  Temp: 99 F (37.2 C)   SpO2: 94% 96%    Intake/Output Summary (Last 24 hours) at 12/20/2019 0918 Last data filed at 12/20/2019 0102 Gross per 24 hour  Intake 660 ml  Output 1600 ml  Net -940 ml   Filed Weights   12/18/19 1420 12/19/19 0030  Weight: 71.7 kg 69 kg   Body mass index is 24.18 kg/m.   Physical Exam: GENERAL: Patient is alert awake and oriented. Not in obvious distress. HENT: No scleral pallor or icterus. Pupils equally reactive to light. Oral mucosa is moist  NECK: is supple, no gross swelling noted. CHEST: Clear to auscultation. No crackles or wheezes.  Diminished breath sounds bilaterally. CVS: S1 and S2 heard, no murmur. Regular rate and rhythm.  ABDOMEN: Soft, distended, Pleurx catheter with dressing, erythema of the right abdominal wall with mild tenderness, . EXTREMITIES: Trace bilateral lower extremity edema CNS: Cranial nerves are intact. No focal motor deficits. SKIN: warm and dry without rashes.  Data Review: I have personally reviewed the following laboratory data and studies,  CBC: Recent Labs  Lab 12/18/19 1455 12/19/19 0551  WBC 10.4 9.1  NEUTROABS 8.3* 7.4  HGB 9.2* 8.1*  HCT 28.9* 26.2*  MCV 87.8 89.7  PLT 528* 332*   Basic Metabolic Panel: Recent Labs  Lab 12/18/19 1455 12/19/19 0551  NA 134* 137  K 3.5 3.3*  CL 100 105  CO2 21* 21*  GLUCOSE 123* 106*  BUN 11 10  CREATININE 0.79 0.74  CALCIUM 8.6* 7.6*  MG  --  1.8   Liver Function Tests: Recent Labs  Lab 12/18/19 1455 12/19/19 0551  AST 43* 30   ALT 28 23  ALKPHOS 197* 173*  BILITOT 0.4 0.4  PROT 6.0* 5.2*  ALBUMIN 2.3* 1.9*   No results for input(s): LIPASE, AMYLASE in the last 168 hours. No results for input(s): AMMONIA in the last 168 hours. Cardiac Enzymes: No results for input(s): CKTOTAL, CKMB, CKMBINDEX, TROPONINI in the last 168 hours. BNP (last 3 results) No results for input(s): BNP in the last 8760 hours.  ProBNP (last 3 results) No results for input(s): PROBNP in the last 8760 hours.  CBG: No results for input(s): GLUCAP in the last 168 hours. Recent Results (from the past 240 hour(s))  Culture, body fluid-bottle     Status: None (Preliminary result)   Collection Time: 12/18/19  4:00 PM   Specimen: Fluid  Result Value Ref Range Status   Specimen Description FLUID PERITONEAL  Final   Special Requests BOTTLES DRAWN AEROBIC AND ANAEROBIC  Final   Culture   Final    NO GROWTH < 24 HOURS Performed at [facility], [place] [address], [place], [place] [address]    Report Status PENDING  Incomplete  Gram stain     Status: None   Collection Time: 12/18/19  4:00 PM   Specimen: Fluid  Result Value Ref Range Status   Specimen Description FLUID PERITONEAL  Final   Special Requests NONE  Final   Gram Stain   Final    MODERATE WBC PRESENT,BOTH PMN AND MONONUCLEAR NO ORGANISMS SEEN Performed at [facility], [address], [place], [place] [address]    Report Status 12/18/2019 FINAL  Final  Resp Panel by RT-PCR (Flu A&B, Covid) Nasopharyngeal Swab     Status: None   Collection Time: 12/18/19  4:19 PM   Specimen: Nasopharyngeal Swab; Nasopharyngeal(NP) swabs in vial transport medium  Result Value Ref Range Status   SARS Coronavirus 2 by RT PCR NEGATIVE NEGATIVE Final    Comment: (NOTE) SARS-CoV-2 target nucleic acids are NOT DETECTED.  The SARS-CoV-2 RNA is generally detectable in upper respiratory specimens during the acute phase of infection. The lowest concentration of SARS-CoV-2 viral copies  this assay can detect is 138 copies/mL. A negative result does not preclude SARS-Cov-2 infection and should not be used as the sole basis for treatment or other patient management decisions. A negative result may occur with  improper specimen collection/handling, submission of specimen other than nasopharyngeal swab, presence of viral mutation(s)  within the areas targeted by this assay, and inadequate number of viral copies(<138 copies/mL). A negative result must be combined with clinical observations, patient history, and epidemiological information. The expected result is Negative.  Fact Sheet for Patients:  EntrepreneurPulse.com.au  Fact Sheet for Healthcare Providers:  IncredibleEmployment.be  This test is no t yet approved or cleared by the [place] FDA and  has been authorized for detection and/or diagnosis of SARS-CoV-2 by FDA under an Emergency Use Authorization (EUA). This EUA will remain  in effect (meaning this test can be used) for the duration of the COVID-19 declaration under Section 564(b)(1) of the Act, 21 U.S.C.section 360bbb-3(b)(1), unless the authorization is terminated  or revoked sooner.       Influenza A by PCR NEGATIVE NEGATIVE Final   Influenza B by PCR NEGATIVE NEGATIVE Final    Comment: (NOTE) The Xpert Xpress SARS-CoV-2/FLU/RSV plus assay is intended as an aid in the diagnosis of influenza from Nasopharyngeal swab specimens and should not be used as a sole basis for treatment. Nasal washings and aspirates are unacceptable for Xpert Xpress SARS-CoV-2/FLU/RSV testing.  Fact Sheet for Patients: EntrepreneurPulse.com.au  Fact Sheet for Healthcare Providers: IncredibleEmployment.be  This test is not yet approved or cleared by the [place] FDA and has been authorized for detection and/or diagnosis of SARS-CoV-2 by FDA under an Emergency Use Authorization (EUA). This EUA  will remain in effect (meaning this test can be used) for the duration of the COVID-19 declaration under Section 564(b)(1) of the Act, 21 U.S.C. section 360bbb-3(b)(1), unless the authorization is terminated or revoked.  Performed at [facility], [address], [place], [place] [address]      Studies: DG Chest 1 View  Result Date: 12/19/2019 CLINICAL DATA:  Pulmonary edema. EXAM: CHEST  1 VIEW COMPARISON:  12/18/2019 FINDINGS: Heart size remains within normal limits. Low lung volumes are again seen. Bibasilar infiltrates are again seen, with probable mild worsening in the left lung base when allowing for changes in positioning. Tiny pleural effusions cannot be excluded. IMPRESSION: Bibasilar infiltrates, with probable mild worsening in the left lung base. Electronically Signed   By: Marlaine Hind M.D.   On: 12/19/2019 05:01   CT ABDOMEN PELVIS W CONTRAST  Result Date: 12/18/2019 CLINICAL DATA:  Abdomen distension history of cervical cancer EXAM: CT ABDOMEN AND PELVIS WITH CONTRAST TECHNIQUE: Multidetector CT imaging of the abdomen and pelvis was performed using the standard protocol following bolus administration of intravenous contrast. CONTRAST:  136mL OMNIPAQUE IOHEXOL 300 MG/ML  SOLN COMPARISON:  None. FINDINGS: Lower chest: Lung bases demonstrate small bilateral pleural effusions and dependent lower lobe atelectasis. Borderline cardiomegaly. Small hiatal hernia. Hepatobiliary: No focal hepatic abnormality. No calcified gallstone or biliary dilatation Pancreas: Unremarkable. No pancreatic ductal dilatation or surrounding inflammatory changes. Spleen: Normal in size without focal abnormality. Adrenals/Urinary Tract: Adrenal glands are normal. Ureteral stent within the left kidney. There is mild bilateral hydronephrosis. Abnormal appearance of the bladder with irregular areas of wall thickening. Masslike thickening of the posterior wall of the bladder with soft tissue density near  the left distal pigtail. Stomach/Bowel: The stomach is nonenlarged. Fluid-filled nondistended small bowel. No acute colon wall thickening. Vascular/Lymphatic: Nonaneurysmal aorta. Subcentimeter Peri aortic lymph nodes. Mildly enlarged bilateral nodes adjacent to the proximal external iliac arteries. On the right, this measures 11 mm and on the left, measures 14 mm. Reproductive: Fluid-filled structure within the central pelvis which appears contiguous with the vagina and presumably represents an obstructed uterus. Fluid  visible within the vaginal cuff. Irregular soft tissue density in the region of the cervix which appears contiguous with irregular soft tissue density at the posterior aspect of the bladder concerning for bladder invasion. Possible dilated right fallopian tube. Other: No free air. Abdominal drainage catheter in the right lower quadrant with the tip terminating in the left mid abdomen. Moderate abdominal ascites and pelvic ascites. Nodular infiltration of the left upper quadrant mesentery and the omentum, consistent with peritoneal metastatic disease. Mild diffuse peritoneal enhancement. Rim enhancing loculated fluid collections within the pelvis. The largest collection is seen posteriorly and measures 6.3 x 3.2 cm. Musculoskeletal: Sclerotic lesions within the left iliac bone, right sacrum and left ischium, concerning for metastatic disease. IMPRESSION: 1. Moderate abdominal and pelvic ascites with peritoneal drainage catheter in place. Mild diffuse peritoneal enhancement could be secondary to metastatic disease or peritonitis. Loculated/mildly rim enhancing fluid collection within the deep posterior pelvis measuring 6.3 cm. 2. Irregular soft tissue density in the region of the cervix, appears contiguous with lobulated soft tissue density at the posterior wall of the bladder concerning for invasion. There are other areas of irregular bladder wall thickening, concerning for neoplastic involvement.  Nodular infiltration of the omentum and left upper quadrant mesentery consistent with peritoneal metastatic disease. Left ureteral stent in place. There is mild bilateral hydronephrosis, suspect secondary to mild obstruction from soft tissue mass at the posterior aspect of the bladder. 3. Fluid-filled structure within the central pelvis, contiguous with fluid-filled vagina presumably represents an obstructed uterus. 4. Small bilateral pleural effusions. 5. Metastatic disease of the pelvic bones. Enlarged pelvic lymph nodes consistent with metastatic disease Electronically Signed   By: Donavan Foil M.D.   On: 12/18/2019 18:01   DG Chest Portable 1 View  Result Date: 12/18/2019 CLINICAL DATA:  Chest pain, cough and weakness. EXAM: PORTABLE CHEST 1 VIEW COMPARISON:  03/10/2018 FINDINGS: Poor inspiration. Hazy/patchy infiltrates in the lower lungs. Small pleural effusion on the right. Findings consistent with bibasilar pneumonia. No dense consolidation or lobar collapse. No significant bone finding. IMPRESSION: Hazy/patchy infiltrates in the lower lungs consistent with bibasilar pneumonia. Small right effusion. Electronically Signed   By: Nelson Chimes M.D.   On: 12/18/2019 16:00      Flora Lipps, MD  [facility] 12/20/2019  If 7PM-7AM, please contact night-coverage

## 2019-12-21 DIAGNOSIS — J189 Pneumonia, unspecified organism: Secondary | ICD-10-CM

## 2019-12-21 LAB — BASIC METABOLIC PANEL
Anion gap: 10 (ref 5–15)
BUN: 10 mg/dL (ref 8–23)
CO2: 21 mmol/L — ABNORMAL LOW (ref 22–32)
Calcium: 7.3 mg/dL — ABNORMAL LOW (ref 8.9–10.3)
Chloride: 107 mmol/L (ref 98–111)
Creatinine, Ser: 0.7 mg/dL (ref 0.44–1.00)
GFR, Estimated: >60 mL/min (ref >60)
Glucose, Bld: 90 mg/dL (ref 70–99)
Potassium: 3.7 mmol/L (ref 3.5–5.1)
Sodium: 138 mmol/L (ref 135–145)

## 2019-12-21 LAB — MAGNESIUM: Magnesium: 1.7 mg/dL (ref 1.7–2.4)

## 2019-12-21 LAB — CBC
HCT: 28.9 % — ABNORMAL LOW (ref 36.0–46.0)
Hemoglobin: 8.7 g/dL — ABNORMAL LOW (ref 12.0–15.0)
MCH: 27.4 pg (ref 26.0–34.0)
MCHC: 30.1 g/dL (ref 30.0–36.0)
MCV: 91.2 fL (ref 80.0–100.0)
Platelets: 839 10*3/uL — ABNORMAL HIGH (ref 150–400)
RBC: 3.17 MIL/uL — ABNORMAL LOW (ref 3.87–5.11)
RDW: 14.9 % (ref 11.5–15.5)
WBC: 9.5 10*3/uL (ref 4.0–10.5)
nRBC: 0 % (ref 0.0–0.2)

## 2019-12-21 LAB — GLUCOSE, CAPILLARY: Glucose-Capillary: 91 mg/dL (ref 70–99)

## 2019-12-21 LAB — URINE CULTURE: Culture: 300 — AB

## 2019-12-21 LAB — PATHOLOGIST SMEAR REVIEW

## 2019-12-21 MED ORDER — SODIUM CHLORIDE 0.9 % IV SOLN
3.0000 g | Freq: Four times a day (QID) | INTRAVENOUS | Status: DC
  Administered 2019-12-21 – 2019-12-23 (×9): 3 g via INTRAVENOUS
  Filled 2019-12-21: qty 3
  Filled 2019-12-21 (×2): qty 8
  Filled 2019-12-21 (×6): qty 3

## 2019-12-21 NOTE — Progress Notes (Signed)
Palliative Medicine RN Note: Rec'd message on the PMT line from [place] asking to speak to Dr Domingo Cocking; please see below. I called her back at the number she left [phone]), and the man who answered said to call her office at [phone]. I attempted to call, but no answer, left voicemail.  I forwarded the message she left to Dr Domingo Cocking. Per her message, Hospice in [place] is not going to take Laura Woodward, and Laura Woodward wants Dr Domingo Cocking to call Laura Woodward then go talk to the pt. If Dr Domingo Cocking is able to get to this message before end of day, he will do what he can to help. However, he is with patients, and the message was left at 4:30pm.  Laura Woodward. Laura Lutze, RN, BSN, [facility] Palliative Medicine Team 12/21/2019 4:58 PM Office [phone]

## 2019-12-21 NOTE — Progress Notes (Addendum)
PROGRESS NOTE  Laura Woodward WJX:[id number] DOB: 1955-01-25 DOA: 12/18/2019 PCP: Patient, No Pcp Per   LOS: 2 days   Brief narrative: As per HPI,  64 year old female with past medical history of metastatic cervical cancer to the bone with peritoneal carcinomatosis (follows with [facility] oncology), hypothyroidism, gastroesophageal reflux disease presented to the hospital with progressive generalized weakness.  Of note, patient was recently hospitalized at [facility] from 11/19 until 11/25 after patient presented with significant ascites and was diagnosed with peritoneal carcinomatosis.  During that hospitalization, a Pleurx tube was placed.  Work-up for infection was negative at that time and was thought to be tumor induced fever.  She also had malignant ureteral obstruction and stent was placed on October 25.  Since that hospitalization patient has been having worsening weakness.  She did receive physical therapy as outpatient.  On 12/15/2019, patient presented to the urology clinic with progressive weakness.    She was also noted to have a possible abdominal wall erysipelas/cellulitis sent home on a course of Keflex. Once again, patient symptoms continue to progressively worsen and patient eventually presented to [place] emergency department for evaluation.  In the ED, this time, patient was tachycardic. CT scan of the abdomen pelvis showed moderate amount of abdominal and pelvic ascites with diffuse peritoneal enhancement possibly secondary to metastatic disease or peritonitis.  Additionally, there was a 6.3 cm enhancing fluid collection in the deep posterior pelvis.  Pleurx tube was once again accessed draining off approximately 1 L of fluid and fluid was sent off for analysis.  Patient was initiated on broad-spectrum intravenous antibiotics.  ED provider had discussed case was discussed with Dr. Patsey Berthold patient's oncologist who recommended hospitalization and treatment of  infection.    Patient was then admitted to hospital for further evaluation and treatment.    Assessment/Plan:  Principal Problem:   Acute bacterial peritonitis ([place]) Active Problems:   Squamous cell carcinoma of cervix (HCC)   Peritoneal carcinomatosis (HCC)   Malignant ascites   Pelvic abscess in female   Cellulitis of abdominal wall   Goals of care, counseling/discussion   Hypothyroidism (acquired)   GERD without esophagitis   Acute bacterial peritonitis with malignant ascites. Status post peritoneal catheter. Peritoneal fluid analysis showed bacterial peritonitis.   CT scan scan of abdomen showed findings concerning for abscess in the deep pelvis. Interventional radiology Dr.Henn has been consulted for possible intervention but upon review recommendation is continuing antibiotic and no need for drainage. on IV antibiotics due to immunocompromised status.  Will change to Unasyn today.  Blood cultures negative in 3 days.  Peritoneal fluid cultures negative so far.  Gram stain negative so far.  Urine culture negative as well.  No leukocytosis or fever at this time.  Plan for oral antibiotics on discharge since patient is considering hospice care.  Spoke with Dr. Domingo Cocking palliative care at bedside.  Hypokalemia.  Improved with oral replacement.  Peritoneal carcinomatosis  Recent diagnosis at [facility] in late November status post Pleurx tube placement. Poor prognosis.  Suspected pelvic abscess abscess in female. Continue IV antibiotic. IR consulted for possible drainage but no intervention planned.   Cellulitis of abdominal wall Patient presents with progressively worsening evidence of abdominal wall cellulitis. Continue IV antibiotic.  Squamous cell carcinoma of cervix with bone metastasis and peritoneal carcinomatosis Patient follows with [facility] oncology however had to be admitted here due to lack of beds at [facility].  Admitting provider had discussed with Dr. Patsey Berthold, patient's  oncologist while  patient was at [facility] who recommended hospitalization and treatment of underlying infection.  Hypothyroidism  Continue Synthroid  GERD without esophagitis Continue PPI  Goals of care.  Palliative care has been consulted.  Patient is willing to discuss about hospice care on discharge  DVT prophylaxis: SCDs Start: 12/19/19 0406  Code Status: DNR  Family Communication: Palliative care discussion with the family today  Status is: Inpatient  Remains inpatient appropriate because:IV treatments appropriate due to intensity of illness or inability to take PO, Inpatient level of care appropriate due to severity of illness and Palliative care consultation, possible hospice   Dispo: The patient is from: Home              Anticipated d/c is to: Home with hospice/hospice facility              Anticipated d/c date is: 2 days              Patient currently is not medically stable to d/c.   Consultants:  Interventional radiology  Palliative care  Procedures:  Peritoneal fluid analysis  Antibiotics:  . Vancomycin, azactam and Flagyl 12/17>12/19 . unasyn 12/20>  Anti-infectives (From admission, onward)   Start     Dose/Rate Route Frequency Ordered Stop   12/19/19 1700  vancomycin (VANCOREADY) IVPB 1500 mg/300 mL        1,500 mg 150 mL/hr over 120 Minutes Intravenous Every 24 hours 12/18/19 1543     12/19/19 0000  aztreonam (AZACTAM) 2 g in sodium chloride 0.9 % 100 mL IVPB        2 g 200 mL/hr over 30 Minutes Intravenous Every 8 hours 12/18/19 1543     12/18/19 1700  vancomycin (VANCOCIN) 500 mg in sodium chloride 0.9 % 100 mL IVPB        500 mg 100 mL/hr over 60 Minutes Intravenous  Once 12/18/19 1543 12/18/19 2059   12/18/19 1548  vancomycin (VANCOCIN) 500 MG powder       Note to Pharmacy: Sandra Cockayne   : cabinet override      12/18/19 1548 12/19/19 0359   12/18/19 1529  aztreonam (AZACTAM) 2 g injection       Note to Pharmacy: Sandra Cockayne   : cabinet override      12/18/19 1529 12/19/19 0344   12/18/19 1515  aztreonam (AZACTAM) 2 g in sodium chloride 0.9 % 100 mL IVPB        2 g 200 mL/hr over 30 Minutes Intravenous  Once 12/18/19 1510 12/18/19 1700   12/18/19 1515  vancomycin (VANCOCIN) IVPB 1000 mg/200 mL premix        1,000 mg 200 mL/hr over 60 Minutes Intravenous  Once 12/18/19 1510 12/18/19 1956   12/18/19 1515  metroNIDAZOLE (FLAGYL) tablet 500 mg        500 mg Oral Every 8 hours 12/18/19 1510         Subjective: Today, patient was seen and examined at bedside.  Feels dizzy lightheaded especially after taking metronidazole.  Denies overt pain.  No nausea or vomiting.   Objective: Vitals:   12/20/19 1332 12/20/19 2112  BP: 129/79 129/81  Pulse: (!) 107 (!) 109  Resp: 20 20  Temp: 99.4 F (37.4 C) 98.8 F (37.1 C)  SpO2: 96% 93%    Intake/Output Summary (Last 24 hours) at 12/21/2019 0713 Last data filed at 12/21/2019 0602 Gross per 24 hour  Intake 1339.81 ml  Output 1300 ml  Net 39.81 ml  Filed Weights   12/18/19 1420 12/19/19 0030  Weight: 71.7 kg 69 kg   Body mass index is 24.18 kg/m.   Physical Exam:  General:  Average built, not in obvious distress, alert awake and communicative. HENT:   No scleral pallor or icterus noted. Oral mucosa is moist.  Chest:  Clear breath sounds.  Diminished breath sounds bilaterally. No crackles or wheezes.  CVS: S1 &S2 heard. No murmur.  Regular rate and rhythm. Abdomen: Soft, mildly distended, peritoneal catheter in place with dressing, erythema of the abdominal wall with minimal tenderness on palpation.     Extremities: No cyanosis, clubbing but with trace bilateral lower extremity edema, peripheral pulses are palpable. Psych: Alert, awake and oriented, normal mood CNS:  No cranial nerve deficits.  Power equal in all extremities.   Skin: Warm and dry.  No rashes noted.   Data Review: I have personally reviewed the following laboratory data and  studies,  CBC: Recent Labs  Lab 12/18/19 1455 12/19/19 0551 12/21/19 0510  WBC 10.4 9.1 9.5  NEUTROABS 8.3* 7.4  --   HGB 9.2* 8.1* 8.7*  HCT 28.9* 26.2* 28.9*  MCV 87.8 89.7 91.2  PLT 528* 508* 326*   Basic Metabolic Panel: Recent Labs  Lab 12/18/19 1455 12/19/19 0551 12/21/19 0510  NA 134* 137 138  K 3.5 3.3* 3.7  CL 100 105 107  CO2 21* 21* 21*  GLUCOSE 123* 106* 90  BUN 11 10 10   CREATININE 0.79 0.74 0.70  CALCIUM 8.6* 7.6* 7.3*  MG  --  1.8 1.7   Liver Function Tests: Recent Labs  Lab 12/18/19 1455 12/19/19 0551  AST 43* 30  ALT 28 23  ALKPHOS 197* 173*  BILITOT 0.4 0.4  PROT 6.0* 5.2*  ALBUMIN 2.3* 1.9*   No results for input(s): LIPASE, AMYLASE in the last 168 hours. No results for input(s): AMMONIA in the last 168 hours. Cardiac Enzymes: No results for input(s): CKTOTAL, CKMB, CKMBINDEX, TROPONINI in the last 168 hours. BNP (last 3 results) No results for input(s): BNP in the last 8760 hours.  ProBNP (last 3 results) No results for input(s): PROBNP in the last 8760 hours.  CBG: No results for input(s): GLUCAP in the last 168 hours. Recent Results (from the past 240 hour(s))  Culture, body fluid-bottle     Status: None (Preliminary result)   Collection Time: 12/18/19  4:00 PM   Specimen: Fluid  Result Value Ref Range Status   Specimen Description FLUID PERITONEAL  Final   Special Requests BOTTLES DRAWN AEROBIC AND ANAEROBIC  Final   Culture   Final    NO GROWTH 2 DAYS Performed at [facility], [address], [place], [place] [address]    Report Status PENDING  Incomplete  Gram stain     Status: None   Collection Time: 12/18/19  4:00 PM   Specimen: Fluid  Result Value Ref Range Status   Specimen Description FLUID PERITONEAL  Final   Special Requests NONE  Final   Gram Stain   Final    MODERATE WBC PRESENT,BOTH PMN AND MONONUCLEAR NO ORGANISMS SEEN Performed at [facility], [address] [place],  [address]     Report Status 12/18/2019 FINAL  Final  Resp Panel by RT-PCR (Flu A&B, Covid) Nasopharyngeal Swab     Status: None   Collection Time: 12/18/19  4:19 PM   Specimen: Nasopharyngeal Swab; Nasopharyngeal(NP) swabs in vial transport medium  Result Value Ref Range Status  SARS Coronavirus 2 by RT PCR NEGATIVE NEGATIVE Final    Comment: (NOTE) SARS-CoV-2 target nucleic acids are NOT DETECTED.  The SARS-CoV-2 RNA is generally detectable in upper respiratory specimens during the acute phase of infection. The lowest concentration of SARS-CoV-2 viral copies this assay can detect is 138 copies/mL. A negative result does not preclude SARS-Cov-2 infection and should not be used as the sole basis for treatment or other patient management decisions. A negative result may occur with  improper specimen collection/handling, submission of specimen other than nasopharyngeal swab, presence of viral mutation(s) within the areas targeted by this assay, and inadequate number of viral copies(<138 copies/mL). A negative result must be combined with clinical observations, patient history, and epidemiological information. The expected result is Negative.  Fact Sheet for Patients:  EntrepreneurPulse.com.au  Fact Sheet for Healthcare Providers:  IncredibleEmployment.be  This test is no t yet approved or cleared by the [place] FDA and  has been authorized for detection and/or diagnosis of SARS-CoV-2 by FDA under an Emergency Use Authorization (EUA). This EUA will remain  in effect (meaning this test can be used) for the duration of the COVID-19 declaration under Section 564(b)(1) of the Act, 21 U.S.C.section 360bbb-3(b)(1), unless the authorization is terminated  or revoked sooner.       Influenza A by PCR NEGATIVE NEGATIVE Final   Influenza B by PCR NEGATIVE NEGATIVE Final    Comment: (NOTE) The Xpert Xpress SARS-CoV-2/FLU/RSV plus assay is intended as an aid in  the diagnosis of influenza from Nasopharyngeal swab specimens and should not be used as a sole basis for treatment. Nasal washings and aspirates are unacceptable for Xpert Xpress SARS-CoV-2/FLU/RSV testing.  Fact Sheet for Patients: EntrepreneurPulse.com.au  Fact Sheet for Healthcare Providers: IncredibleEmployment.be  This test is not yet approved or cleared by the [place] FDA and has been authorized for detection and/or diagnosis of SARS-CoV-2 by FDA under an Emergency Use Authorization (EUA). This EUA will remain in effect (meaning this test can be used) for the duration of the COVID-19 declaration under Section 564(b)(1) of the Act, 21 U.S.C. section 360bbb-3(b)(1), unless the authorization is terminated or revoked.  Performed at [facility], [address] [place], [place] [address]   Urine culture     Status: None (Preliminary result)   Collection Time: 12/18/19  8:30 PM   Specimen: In/Out Cath Urine  Result Value Ref Range Status   Specimen Description   Final    IN/OUT CATH URINE Performed at [facility], [address] [place], [place] [address]    Special Requests   Final    NONE Performed at [facility], [address] [place], [place] [address]    Culture   Final    CULTURE REINCUBATED FOR BETTER GROWTH Performed at [facility], [place] [address] [place], [place] [address]    Report Status PENDING  Incomplete     Studies: No results found.    Flora Lipps, MD  [facility] 12/21/2019  If 7PM-7AM, please contact night-coverage

## 2019-12-21 NOTE — Plan of Care (Signed)
  Problem: Activity: Goal: Risk for activity intolerance will decrease Outcome: Progressing   Problem: Nutrition: Goal: Adequate nutrition will be maintained Outcome: Progressing   Problem: Clinical Measurements: Goal: Respiratory complications will improve Outcome: Completed/Met

## 2019-12-21 NOTE — Progress Notes (Signed)
Daily Progress Note   Patient Name: Laura Woodward       Date: 12/21/2019 DOB: 14-Aug-1955  Age: 64 y.o. MRN#: [id number] Attending Physician: Flora Lipps, MD Primary Care Physician: Patient, No Pcp Per Admit Date: 12/18/2019  Reason for Consultation/Follow-up: Establishing goals of care  Subjective: I saw and examined Laura Woodward today.  She was lying in bed in no distress with the lights off when I enter the room.  She aroused easily and we discussed again regarding her overall situation and care plan moving forward.  She reports being tired this evening but would like for me to speak with her healthcare power of attorney, Laura Woodward, and also to set up a family meeting for tomorrow for the 3 of [place] to discuss via phone.  I called and was able to reach [facility] per Ms. [name]'s request.  We had a brief review of her overall situation and talked about a plan to have more formal meeting tomorrow at 10:30 AM to discuss options of care moving forward.  Length of Stay: 2  Current Medications: Scheduled Meds:   guaiFENesin  600 mg Oral BID   levothyroxine  125 mcg Oral Q0600   pantoprazole  40 mg Oral Daily    Continuous Infusions:  ampicillin-sulbactam (UNASYN) IV      PRN Meds: acetaminophen **OR** acetaminophen, guaiFENesin-codeine, morphine injection, oxyCODONE-acetaminophen, polyethylene glycol, prochlorperazine, tiZANidine, traZODone  Physical Exam         General: Alert, awake, in no acute distress.  Heart: Regular rate and rhythm. No murmur appreciated. Lungs: Good air movement, clear Abdomen:  Nondistended, Pleurx in place, positive bowel sounds.  Ext: No significant edema Skin: Warm and dry Neuro: Grossly intact, nonfocal.  Vital Signs: BP 129/81 (BP  Location: Left Arm)    Pulse (!) 109    Temp 98.8 F (37.1 C) (Oral)    Resp 20    Ht 5' 6.5" (1.689 m)    Wt 69 kg    SpO2 93%    BMI 24.18 kg/m  SpO2: SpO2: 93 % O2 Device: O2 Device: Room Air O2 Flow Rate:    Intake/output summary:   Intake/Output Summary (Last 24 hours) at 12/21/2019 0923 Last data filed at 12/21/2019 0602 Gross per 24 hour  Intake 1339.81 ml  Output 1300 ml  Net 39.81 ml  LBM: Last BM Date: 12/19/19 Baseline Weight: Weight: 71.7 kg Most recent weight: Weight: 69 kg       Palliative Assessment/Data:    Flowsheet Rows   Flowsheet Row Most Recent Value  Intake Tab   Referral Department Hospitalist  Unit at Time of Referral Med/Surg Unit  Palliative Care Primary Diagnosis Cancer  Date Notified 12/19/19  Palliative Care Type New Palliative care  Reason for referral Clarify Goals of Care  Date of Admission 12/18/19  Date first seen by Palliative Care 12/19/19  # of days Palliative referral response time 0 Day(s)  # of days IP prior to Palliative referral 1  Clinical Assessment   Palliative Performance Scale Score 50%  Psychosocial & Spiritual Assessment   Palliative Care Outcomes   Patient/Family meeting held? Yes  Who was at the meeting? Patient      Patient Active Problem List   Diagnosis Date Noted   Peritoneal carcinomatosis ([place]) 12/19/2019   Malignant ascites 12/19/2019   Acute bacterial peritonitis ([place]) 12/19/2019   Pelvic abscess in female 12/19/2019   Cellulitis of abdominal wall 12/19/2019   Goals of care, counseling/discussion 12/19/2019   Hypothyroidism (acquired) 12/19/2019   GERD without esophagitis 12/19/2019   Carcinomatosis ([place]) 11/22/2019   Neutropenic fever ([place]) 03/10/2018   SIRS (systemic inflammatory response syndrome) ([place]) 03/10/2018   Squamous cell carcinoma of cervix ([place]) 03/10/2018    Palliative Care Assessment & Plan   Patient Profile: 64 y.o. female  with past medical history of metastatic  cervical cancer to the bone with peritoneal carcinomatosis who follows at [facility] and recently had Pleurx placed, hypothyroidism, GERD admitted on 12/18/2019 after presenting to the ED with generalized weakness.  Weakness has been progressive and got particularly worse after last round of chemotherapy (topotecan on 12/7).  She was seen by [facility] oncology clinic on 12/14 where she was given fluids and had Pleurx tube drained.  She was noted to have possible cellulitis and sent home with Keflex.  Care everywhere review reveals that she had also been referred to home hospice at that time.  After presentation to the ED, she is currently being treated for SBP and also has CT of the abdomen concerning for abscess in the deep pelvis.  Interventional radiology is going to evaluate for potential drainage.  She is currently on broad-spectrum antibiotics.  Palliative consulted for goals of care  Assessment: Patient Active Problem List   Diagnosis Date Noted   Peritoneal carcinomatosis ([place]) 12/19/2019   Malignant ascites 12/19/2019   Acute bacterial peritonitis ([place]) 12/19/2019   Pelvic abscess in female 12/19/2019   Cellulitis of abdominal wall 12/19/2019   Goals of care, counseling/discussion 12/19/2019   Hypothyroidism (acquired) 12/19/2019   GERD without esophagitis 12/19/2019   Carcinomatosis ([place]) 11/22/2019   Neutropenic fever ([place]) 03/10/2018   SIRS (systemic inflammatory response syndrome) ([place]) 03/10/2018   Squamous cell carcinoma of cervix ([place]) 03/10/2018    Recommendations/Plan: -DNR/DNI -Her friend, Laura Woodward, is her healthcare power of attorney. -Continue current interventions for acute infection.  Strong consideration for hospice services on discharge. -She has been speaking with [facility] about enrolling in hospice services.  A referral was placed by [facility] to [facility] in [place] but she has not yet been enrolled in services.  She is not opposed to hospice services and is  thinking that this will be the plan when she discharges from her hospital.  She has been going back and forth between [place] and [place] and will need to ensure  wherever she goes on discharge that her preferred hospice agency covers that area. -Plan to follow-up tomorrow at 1030 with patient and her HCPOA Laura Woodward) to be part of conversation via phone at that time.  Goals of Care and Additional Recommendations: Limitations on Scope of Treatment: Avoid Hospitalization  Code Status:    Code Status Orders  (From admission, onward)         Start     Ordered   12/19/19 0406  Do not attempt resuscitation (DNR)  Continuous       Question Answer Comment  In the event of cardiac or respiratory ARREST Do not call a code blue   In the event of cardiac or respiratory ARREST Do not perform Intubation, CPR, defibrillation or ACLS   In the event of cardiac or respiratory ARREST Use medication by any route, position, wound care, and other measures to relive pain and suffering. May use oxygen, suction and manual treatment of airway obstruction as needed for comfort.      12/19/19 0407        Code Status History    Date Active Date Inactive Code Status Order ID Comments User Context   03/10/2018 1720 03/14/2018 1929 Full Code [id number]  Louellen Molder, MD Inpatient   Advance Care Planning Activity    Advance Directive Documentation   Flowsheet Row Most Recent Value  Type of Advance Directive Out of facility DNR (pink MOST or yellow form)  Pre-existing out of facility DNR order (yellow form or pink MOST form) Physician notified to receive inpatient order  "MOST" Form in Place? --      Prognosis:  < 6 months  Discharge Planning: Home with Hospice?  Care plan was discussed with patient, Laura Woodward via phone  Thank you for allowing the Palliative Medicine Team to assist in the care of this patient.   Time In: 1905 Time Out: 1930 Total Time 25 Prolonged Time Billed No      Greater than 50%   of this time was spent counseling and coordinating care related to the above assessment and plan.  Micheline Rough, MD  Please contact Palliative Medicine Team phone at [phone] for questions and concerns.

## 2019-12-21 NOTE — Progress Notes (Signed)
Daily Progress Note   Patient Name: Laura Woodward       Date: 12/21/2019 DOB: [birth year]  Age: 64 y.o. MRN#: [id number] Attending Physician: Flora Lipps, MD Primary Care Physician: Patient, No Pcp Per Admit Date: 12/18/2019  Reason for Consultation/Follow-up: Establishing goals of care  Subjective: I saw and examined Laura Woodward today.  She reports that she does not feel as good today as she did yesterday.  We called and had a goals of care meeting with her Mellen, Elpidio Eric, on the phone as well.  Overall, Laura Woodward understands that she has advanced disease and would likely benefit from hospice services at time of discharge.  She has been talking with community hospice out of [place] as a referral has been placed to them by her oncologist at [facility] prior to this admission.  We discussed hospice philosophy as well as options for hospice care.  This included: 1) home with hospice services (she and Laura Woodward are both concerned that she is not going to be able to manage her care needs at home), 2) hospice in a long-term care facility (she reports that the financial burden would be too high as room and board is not covered as part of hospice benefits), or 3) residential hospice (I am not sure she requalification for this, but I offered to call and discuss with hospice liaison if she desires.)  Following discussion, both patient and Laura Woodward would like for her to residential hospice placement on discharge.  We did discuss my concern that we would need to continue to work on a secondary plan if she is deemed not to be a candidate for residential hospice.  Laura Woodward also asked me to call and speak with Laura Woodward from community home care and hospice in [place] [phone]).  I called and was  able to reach Laura Woodward.  I discussed with her my conversation with [name] and [name] confirms that community does not have residential hospice facility.  I then called and discussed with liaison from [facility] the [place] who will review chart for potential eligibility for residential hospice.  Length of Stay: 2  Current Medications: Scheduled Meds:  . guaiFENesin  600 mg Oral BID  . levothyroxine  125 mcg Oral Q0600  . pantoprazole  40 mg Oral Daily    Continuous Infusions: .  ampicillin-sulbactam (UNASYN) IV 3 g (12/21/19 1611)    PRN Meds: acetaminophen **OR** acetaminophen, guaiFENesin-codeine, morphine injection, oxyCODONE-acetaminophen, polyethylene glycol, prochlorperazine, tiZANidine, traZODone  Physical Exam         General: Alert, awake, in no acute distress.  Heart: Regular rate and rhythm. No murmur appreciated. Lungs: Good air movement, clear Abdomen:  Nondistended, Pleurx in place, positive bowel sounds.  Ext: No significant edema Skin: Warm and dry Neuro: Grossly intact, nonfocal.  Vital Signs: BP 110/62 (BP Location: Right Arm)   Pulse (!) 114   Temp 98.9 F (37.2 C) (Oral)   Resp (!) 23   Ht 5' 6.5" (1.689 m)   Wt 69 kg   SpO2 95%   BMI 24.18 kg/m  SpO2: SpO2: 95 % O2 Device: O2 Device: Room Air O2 Flow Rate:    Intake/output summary:   Intake/Output Summary (Last 24 hours) at 12/21/2019 2020 Last data filed at 12/21/2019 1700 Gross per 24 hour  Intake 1394.28 ml  Output 1 ml  Net 1393.28 ml   LBM: Last BM Date: 12/19/19 Baseline Weight: Weight: 71.7 kg Most recent weight: Weight: 69 kg       Palliative Assessment/Data:    Flowsheet Rows   Flowsheet Row Most Recent Value  Intake Tab   Referral Department Hospitalist  Unit at Time of Referral Med/Surg Unit  Palliative Care Primary Diagnosis Cancer  Date Notified 12/19/19  Palliative Care Type New Palliative care  Reason for referral Clarify Goals of Care  Date of Admission  12/18/19  Date first seen by Palliative Care 12/19/19  # of days Palliative referral response time 0 Day(s)  # of days IP prior to Palliative referral 1  Clinical Assessment   Palliative Performance Scale Score 50%  Psychosocial & Spiritual Assessment   Palliative Care Outcomes   Patient/Family meeting held? Yes  Who was at the meeting? Patient      Patient Active Problem List   Diagnosis Date Noted  . Peritoneal carcinomatosis ([place]) 12/19/2019  . Malignant ascites 12/19/2019  . Acute bacterial peritonitis ([place]) 12/19/2019  . Pelvic abscess in female 12/19/2019  . Cellulitis of abdominal wall 12/19/2019  . Goals of care, counseling/discussion 12/19/2019  . Hypothyroidism (acquired) 12/19/2019  . GERD without esophagitis 12/19/2019  . Carcinomatosis ([place]) 11/22/2019  . Neutropenic fever ([place]) 03/10/2018  . SIRS (systemic inflammatory response syndrome) ([place]) 03/10/2018  . Squamous cell carcinoma of cervix ([place]) 03/10/2018    Palliative Care Assessment & Plan   Patient Profile: 64 y.o. female  with past medical history of metastatic cervical cancer to the bone with peritoneal carcinomatosis who follows at [facility] and recently had Pleurx placed, hypothyroidism, GERD admitted on 12/18/2019 after presenting to the ED with generalized weakness.  Weakness has been progressive and got particularly worse after last round of chemotherapy (topotecan on 12/7).  She was seen by [facility] oncology clinic on 12/14 where she was given fluids and had Pleurx tube drained.  She was noted to have possible cellulitis and sent home with Keflex.  Care everywhere review reveals that she had also been referred to home hospice at that time.  After presentation to the ED, she is currently being treated for SBP and also has CT of the abdomen concerning for abscess in the deep pelvis.  Interventional radiology is going to evaluate for potential drainage.  She is currently on broad-spectrum antibiotics.  Palliative  consulted for goals of care  Assessment: Patient Active Problem List   Diagnosis Date Noted  .  Peritoneal carcinomatosis ([place]) 12/19/2019  . Malignant ascites 12/19/2019  . Acute bacterial peritonitis ([place]) 12/19/2019  . Pelvic abscess in female 12/19/2019  . Cellulitis of abdominal wall 12/19/2019  . Goals of care, counseling/discussion 12/19/2019  . Hypothyroidism (acquired) 12/19/2019  . GERD without esophagitis 12/19/2019  . Carcinomatosis ([place]) 11/22/2019  . Neutropenic fever ([place]) 03/10/2018  . SIRS (systemic inflammatory response syndrome) ([place]) 03/10/2018  . Squamous cell carcinoma of cervix ([place]) 03/10/2018    Recommendations/Plan: -DNR/DNI -Her friend, Elpidio Eric, is her healthcare power of attorney. -Discussed hospice options and care with Ms. Downen and Laura Woodward today.  She reports being agreeable to hospice services on discharge, but there is concern about where she is going to go at time of discharge.  She is interested in residential hospice if she is a candidate.  We did discuss that we would likely need to have secondary plan as I am concerned about her eligibility, but I would also ask for her to be evaluated by [facility] the [place] as her preference would be to transition to either [facility] or [place] as this is where her support system is located. -Discussed with hospice liaison who will review chart for potential residential hospice eligibility.  Goals of Care and Additional Recommendations: Limitations on Scope of Treatment: Avoid Hospitalization  Code Status:    Code Status Orders  (From admission, onward)         Start     Ordered   12/19/19 0406  Do not attempt resuscitation (DNR)  Continuous       Question Answer Comment  In the event of cardiac or respiratory ARREST Do not call a "code blue"   In the event of cardiac or respiratory ARREST Do not perform Intubation, CPR, defibrillation or ACLS   In the event of cardiac or respiratory ARREST  Use medication by any route, position, wound care, and other measures to relive pain and suffering. May use oxygen, suction and manual treatment of airway obstruction as needed for comfort.      12/19/19 0407        Code Status History    Date Active Date Inactive Code Status Order ID Comments User Context   03/10/2018 1720 03/14/2018 1929 Full Code [id number]  Louellen Molder, MD Inpatient   Advance Care Planning Activity    Advance Directive Documentation   Flowsheet Row Most Recent Value  Type of Advance Directive Out of facility DNR (pink MOST or yellow form)  Pre-existing out of facility DNR order (yellow form or pink MOST form) Physician notified to receive inpatient order  "MOST" Form in Place? --      Prognosis:  < 6 months  Discharge Planning: To be determined-patient requesting evaluation for residential hospice  Care plan was discussed with patient, Laura Woodward via phone  Thank you for allowing the Palliative Medicine Team to assist in the care of this patient.   Time In: 1040 Time Out: 1120 Total Time 40 Prolonged Time Billed No   Greater than 50%  of this time was spent counseling and coordinating care related to the above assessment and plan.  Micheline Rough, MD  Please contact Palliative Medicine Team phone at [phone] for questions and concerns.

## 2019-12-22 NOTE — TOC Progression Note (Signed)
Transition of Care [facility]) - Progression Note    Patient Details  Name: Laura Woodward MRN: [id number] Date of Birth: 1955-01-03  [facility] CM/SW Contact  Joaquin Courts, RN Phone Number: 12/22/2019, 2:34 PM  Clinical Narrative:    CM spoke with patient and Elpidio Eric re discharge planning.  CM discussed that patient is not a candidate for residential hospice and answered questions.  Sonya questions re options for SNF vs ALF.   CM discussed that insurance does not cover the cost of alf and will require out of pocket payment unless patient is a candidate for medicaid which she does not currently have and would then need to apply for this.  Long term SNF also requires out of pocket payment vs long-term medicaid payment.  Patient cannot afford to pay out of pocket for these services per [facility].  Patient elects to return home with home hospice services and understands and is agreeable to have hospice nurse come out to the home and make a visit to admit to agency.  Patient will need assistance from [facility] and other friends in cleaning out her home as per reports she has many things in the home that will make it difficult to place equipment such as hospital bed (per patient she would like this delivered after the hospice visit).  Hospice choice offered and referral made to hospice of the [place], per rep nurse can make visit tomorrow 12/21 to admit patient to services.  Patient was made aware of this and is working on making arrangements to help clean out home and [name] is planning to help transport patient home at time of discharge.      Expected Discharge Plan: Home w Hospice Care Barriers to Discharge: Continued Medical Work up  Expected Discharge Plan and Services Expected Discharge Plan: [place]   Discharge Planning Services: CM Consult Post Acute Care Choice: Hospice Living arrangements for the past 2 months: Single Family Home                 DME  Arranged: N/A                     Social Determinants of Health (SDOH) Interventions    Readmission Risk Interventions No flowsheet data found.

## 2019-12-22 NOTE — Progress Notes (Signed)
PROGRESS NOTE  Graclynn Vanantwerp DZH:[id number] DOB: 06/07/1955 DOA: 12/18/2019 PCP: Patient, No Pcp Per   LOS: 3 days   Brief narrative: As per HPI,  64 year old female with past medical history of metastatic cervical cancer to the bone with peritoneal carcinomatosis (follows with [facility] oncology), hypothyroidism, gastroesophageal reflux disease presented to the hospital with progressive generalized weakness.  Of note, patient was recently hospitalized at [facility] from 11/19 until 11/25 after patient presented with significant ascites and was diagnosed with peritoneal carcinomatosis.  During that hospitalization, a Pleurx tube was placed.  Work-up for infection was negative at that time and was thought to be tumor induced fever.  She also had malignant ureteral obstruction and stent was placed on October 25.  Since that hospitalization, patient has been having worsening weakness.  She did receive physical therapy as outpatient.  On 12/15/2019, patient presented to the urology clinic with progressive weakness.    She was also noted to have a possible abdominal wall erysipelas/cellulitis sent home on a course of Keflex. Once again, patient symptoms continue to progressively worsen and patient eventually presented to [place] emergency department for evaluation.  In the ED, this time, patient was tachycardic. CT scan of the abdomen pelvis showed moderate amount of abdominal and pelvic ascites with diffuse peritoneal enhancement possibly secondary to metastatic disease or peritonitis.  Additionally, there was a 6.3 cm enhancing fluid collection in the deep posterior pelvis.  Pleurx tube was once again accessed draining off approximately 1 L of fluid and fluid was sent off for analysis.  Patient was initiated on broad-spectrum intravenous antibiotics.  ED provider had discussed case was discussed with Dr. Patsey Berthold patient's oncologist who recommended hospitalization and treatment of  infection.    Patient was then admitted to hospital for further evaluation and treatment.    Assessment/Plan:  Principal Problem:   Acute bacterial peritonitis ([place]) Active Problems:   Squamous cell carcinoma of cervix (HCC)   Peritoneal carcinomatosis (HCC)   Malignant ascites   Pelvic abscess in female   Cellulitis of abdominal wall   Goals of care, counseling/discussion   Hypothyroidism (acquired)   GERD without esophagitis  Acute bacterial peritonitis with malignant ascites. Status post peritoneal catheter. Peritoneal fluid analysis showed bacterial peritonitis.   CT scan scan of abdomen showed findings concerning for abscess in the deep pelvis. Interventional radiology Dr.Henn has been consulted for possible intervention but upon review recommendation is continuing antibiotic and no need for drainage. on IV antibiotics due to immunocompromised status.  This has been changed to Unasyn.  Blood cultures negative in 3 days.  Peritoneal fluid cultures and  Gram stain negative so far.  Urine culture with 300 colonies of yeast likely colonization.  No leukocytosis or fever at this time.  Plan for oral antibiotics on discharge since patient is considering hospice care.    Hypokalemia.  Improved with oral replacement. Latest potassium 3.7.  Peritoneal carcinomatosis  Recent diagnosis at [facility] in late November status post Pleurx tube placement. Poor prognosis.  Suspected pelvic abscess abscess in female. Continue IV antibiotic. IR consulted for possible drainage but no intervention planned. Could transition to oral antibiotic on discharge.  Cellulitis of abdominal wall Erythema persists.  Not much tender.  On IV antibiotic.  Transition to oral on hospice discharge  Squamous cell carcinoma of cervix with bone metastasis and peritoneal carcinomatosis Patient follows with [facility] oncology however had to be admitted here due to lack of beds at [facility].  Admitting provider had  discussed with  Dr. Patsey Berthold, patient's oncologist while patient was at [facility] who recommended hospitalization and treatment of underlying infection.  Poor prognosis and considering hospice at this time.  Hypothyroidism  Continue Synthroid   GERD without esophagitis Continue PPI  Goals of care.  Palliative care on board.  Plan for hospice care.  DVT prophylaxis: SCDs Start: 12/19/19 0406  Code Status: DNR  Family Communication: Palliative care in discussion with the family.  Status is: Inpatient  Remains inpatient appropriate because:IV treatments appropriate due to intensity of illness or inability to take PO, Inpatient level of care appropriate due to severity of illness and Palliative care/ hospice   Dispo: The patient is from: Home              Anticipated d/c is to: Home with hospice/hospice facility              Anticipated d/c date is: When arrangements done for hospice in 1 to 2 days, likely tomorrow with home hospice.              Patient currently is medically stable to d/c to hospice..   Consultants:  Interventional radiology  Palliative care  Procedures:  Peritoneal fluid analysis  Antibiotics:  . Vancomycin, azactam and Flagyl 12/17>12/19 . unasyn 12/20>  Anti-infectives (From admission, onward)   Start     Dose/Rate Route Frequency Ordered Stop   12/21/19 1015  Ampicillin-Sulbactam (UNASYN) 3 g in sodium chloride 0.9 % 100 mL IVPB        3 g 200 mL/hr over 30 Minutes Intravenous Every 6 hours 12/21/19 0919     12/19/19 1700  vancomycin (VANCOREADY) IVPB 1500 mg/300 mL  Status:  Discontinued        1,500 mg 150 mL/hr over 120 Minutes Intravenous Every 24 hours 12/18/19 1543 12/21/19 0919   12/19/19 0000  aztreonam (AZACTAM) 2 g in sodium chloride 0.9 % 100 mL IVPB  Status:  Discontinued        2 g 200 mL/hr over 30 Minutes Intravenous Every 8 hours 12/18/19 1543 12/21/19 0919   12/18/19 1700  vancomycin (VANCOCIN) 500 mg in sodium chloride 0.9 %  100 mL IVPB        500 mg 100 mL/hr over 60 Minutes Intravenous  Once 12/18/19 1543 12/18/19 2059   12/18/19 1548  vancomycin (VANCOCIN) 500 MG powder       Note to Pharmacy: Sandra Cockayne   : cabinet override      12/18/19 1548 12/19/19 0359   12/18/19 1529  aztreonam (AZACTAM) 2 g injection       Note to Pharmacy: Sandra Cockayne   : cabinet override      12/18/19 1529 12/19/19 0344   12/18/19 1515  aztreonam (AZACTAM) 2 g in sodium chloride 0.9 % 100 mL IVPB        2 g 200 mL/hr over 30 Minutes Intravenous  Once 12/18/19 1510 12/18/19 1700   12/18/19 1515  vancomycin (VANCOCIN) IVPB 1000 mg/200 mL premix        1,000 mg 200 mL/hr over 60 Minutes Intravenous  Once 12/18/19 1510 12/18/19 1956   12/18/19 1515  metroNIDAZOLE (FLAGYL) tablet 500 mg  Status:  Discontinued        500 mg Oral Every 8 hours 12/18/19 1510 12/21/19 0919     Subjective: Today, patient was seen and examined at bedside. Patient denies any nausea vomiting or overt abdominal pain. Denies shortness of breath, cough fever.  Objective: Vitals:   12/21/19 2059 12/22/19 0523  BP:  119/71  Pulse: (!) 110 (!) 110  Resp:  20  Temp:  98.3 F (36.8 C)  SpO2:  94%    Intake/Output Summary (Last 24 hours) at 12/22/2019 0743 Last data filed at 12/22/2019 0500 Gross per 24 hour  Intake 1220 ml  Output 401 ml  Net 819 ml   Filed Weights   12/18/19 1420 12/19/19 0030  Weight: 71.7 kg 69 kg   Body mass index is 24.18 kg/m.   Physical Exam:  General:  Average built, not in obvious distress, alert awake and communicative. HENT:   No scleral pallor or icterus noted. Oral mucosa is moist.  Chest:  Clear breath sounds.  Diminished breath sounds bilaterally. No crackles or wheezes.  CVS: S1 &S2 heard. No murmur.  Regular rate and rhythm. Abdomen: Soft, mildly distended, peritoneal catheter in place with dressing, erythema of the abdominal wall with minimal tenderness on palpation.     Extremities: No cyanosis, clubbing  but with trace bilateral lower extremity edema, peripheral pulses are palpable. Psych: Alert, awake and oriented,  CNS:  No cranial nerve deficits.  Power equal in all extremities.   Skin: Warm and dry.  No rashes noted.   Data Review: I have personally reviewed the following laboratory data and studies,  CBC: Recent Labs  Lab 12/18/19 1455 12/19/19 0551 12/21/19 0510  WBC 10.4 9.1 9.5  NEUTROABS 8.3* 7.4  --   HGB 9.2* 8.1* 8.7*  HCT 28.9* 26.2* 28.9*  MCV 87.8 89.7 91.2  PLT 528* 508* 017*   Basic Metabolic Panel: Recent Labs  Lab 12/18/19 1455 12/19/19 0551 12/21/19 0510  NA 134* 137 138  K 3.5 3.3* 3.7  CL 100 105 107  CO2 21* 21* 21*  GLUCOSE 123* 106* 90  BUN 11 10 10   CREATININE 0.79 0.74 0.70  CALCIUM 8.6* 7.6* 7.3*  MG  --  1.8 1.7   Liver Function Tests: Recent Labs  Lab 12/18/19 1455 12/19/19 0551  AST 43* 30  ALT 28 23  ALKPHOS 197* 173*  BILITOT 0.4 0.4  PROT 6.0* 5.2*  ALBUMIN 2.3* 1.9*   No results for input(s): LIPASE, AMYLASE in the last 168 hours. No results for input(s): AMMONIA in the last 168 hours. Cardiac Enzymes: No results for input(s): CKTOTAL, CKMB, CKMBINDEX, TROPONINI in the last 168 hours. BNP (last 3 results) No results for input(s): BNP in the last 8760 hours.  ProBNP (last 3 results) No results for input(s): PROBNP in the last 8760 hours.  CBG: Recent Labs  Lab 12/21/19 0930  GLUCAP 91   Recent Results (from the past 240 hour(s))  Blood culture (routine x 2)     Status: None (Preliminary result)   Collection Time: 12/18/19  2:45 PM   Specimen: BLOOD LEFT ARM  Result Value Ref Range Status   Specimen Description   Final    BLOOD LEFT ARM Performed at [facility], [address] [place], [place] [address]    Special Requests   Final    BOTTLES DRAWN AEROBIC AND ANAEROBIC Blood Culture adequate volume Performed at [facility], [address] [place], [place] [address]    Culture    Final    NO GROWTH 3 DAYS Performed at [facility], [place] [address], [place], [place] [address]    Report Status PENDING  Incomplete  Blood culture (routine x 2)  Status: None (Preliminary result)   Collection Time: 12/18/19  2:55 PM   Specimen: BLOOD RIGHT ARM  Result Value Ref Range Status   Specimen Description   Final    BLOOD RIGHT ARM Performed at [facility], [address] [place], [place] [address]    Special Requests   Final    BOTTLES DRAWN AEROBIC AND ANAEROBIC Blood Culture adequate volume Performed at [facility], [address] [place], [place] [address]    Culture   Final    NO GROWTH 3 DAYS Performed at [facility], [place] [address], [place], [place] [address]    Report Status PENDING  Incomplete  Culture, body fluid-bottle     Status: None (Preliminary result)   Collection Time: 12/18/19  4:00 PM   Specimen: Fluid  Result Value Ref Range Status   Specimen Description FLUID PERITONEAL  Final   Special Requests BOTTLES DRAWN AEROBIC AND ANAEROBIC  Final   Culture   Final    NO GROWTH 3 DAYS Performed at [facility], [place] [address], [place], [place] [address]    Report Status PENDING  Incomplete  Gram stain     Status: None   Collection Time: 12/18/19  4:00 PM   Specimen: Fluid  Result Value Ref Range Status   Specimen Description FLUID PERITONEAL  Final   Special Requests NONE  Final   Gram Stain   Final    MODERATE WBC PRESENT,BOTH PMN AND MONONUCLEAR NO ORGANISMS SEEN Performed at [facility], [address], [place], [place] [address]    Report Status 12/18/2019 FINAL  Final  Resp Panel by RT-PCR (Flu A&B, Covid) Nasopharyngeal Swab     Status: None   Collection Time: 12/18/19  4:19 PM   Specimen: Nasopharyngeal Swab; Nasopharyngeal(NP) swabs in vial transport medium  Result Value Ref Range Status   SARS Coronavirus 2 by RT PCR NEGATIVE NEGATIVE Final    Comment: (NOTE) SARS-CoV-2 target  nucleic acids are NOT DETECTED.  The SARS-CoV-2 RNA is generally detectable in upper respiratory specimens during the acute phase of infection. The lowest concentration of SARS-CoV-2 viral copies this assay can detect is 138 copies/mL. A negative result does not preclude SARS-Cov-2 infection and should not be used as the sole basis for treatment or other patient management decisions. A negative result may occur with  improper specimen collection/handling, submission of specimen other than nasopharyngeal swab, presence of viral mutation(s) within the areas targeted by this assay, and inadequate number of viral copies(<138 copies/mL). A negative result must be combined with clinical observations, patient history, and epidemiological information. The expected result is Negative.  Fact Sheet for Patients:  EntrepreneurPulse.com.au  Fact Sheet for Healthcare Providers:  IncredibleEmployment.be  This test is no t yet approved or cleared by the [place] FDA and  has been authorized for detection and/or diagnosis of SARS-CoV-2 by FDA under an Emergency Use Authorization (EUA). This EUA will remain  in effect (meaning this test can be used) for the duration of the COVID-19 declaration under Section 564(b)(1) of the Act, 21 U.S.C.section 360bbb-3(b)(1), unless the authorization is terminated  or revoked sooner.       Influenza A by PCR NEGATIVE NEGATIVE Final   Influenza B by PCR NEGATIVE NEGATIVE Final    Comment: (NOTE) The Xpert Xpress SARS-CoV-2/FLU/RSV plus assay is intended as an aid in the diagnosis of influenza from Nasopharyngeal swab specimens and should not be used as  a sole basis for treatment. Nasal washings and aspirates are unacceptable for Xpert Xpress SARS-CoV-2/FLU/RSV testing.  Fact Sheet for Patients: EntrepreneurPulse.com.au  Fact Sheet for Healthcare  Providers: IncredibleEmployment.be  This test is not yet approved or cleared by the [place] FDA and has been authorized for detection and/or diagnosis of SARS-CoV-2 by FDA under an Emergency Use Authorization (EUA). This EUA will remain in effect (meaning this test can be used) for the duration of the COVID-19 declaration under Section 564(b)(1) of the Act, 21 U.S.C. section 360bbb-3(b)(1), unless the authorization is terminated or revoked.  Performed at [facility], [address] [place], [place] [address]   Urine culture     Status: Abnormal   Collection Time: 12/18/19  8:30 PM   Specimen: In/Out Cath Urine  Result Value Ref Range Status   Specimen Description   Final    IN/OUT CATH URINE Performed at [facility], [address] [place], [place] [address]    Special Requests   Final    NONE Performed at [facility], [address] [place], [place] [address]    Culture 300 COLONIES/mL YEAST (A)  Final   Report Status 12/21/2019 FINAL  Final     Studies: No results found.    Flora Lipps, MD  [facility] 12/22/2019  If 7PM-7AM, please contact night-coverage

## 2019-12-22 NOTE — Progress Notes (Signed)
   Pt was reviewed for [place]. Unfortunately this is what the HCPOA, Lurena Joiner, has requested and she does not meet GIP criteria at this time and it is uncertain she has a 2 week or less prognosis. Discussed with Pt friend [name] she is disappointed and reports pt can not go home without help. The pt is appropriate for hospice care at home but [name] states that this is not an option.   I have updated Dr. Domingo Cocking and CM Louanna Raw with transitional care team. Webb Silversmith RN [phone]

## 2019-12-23 LAB — CULTURE, BODY FLUID W GRAM STAIN -BOTTLE: Culture: NO GROWTH

## 2019-12-23 LAB — CULTURE, BLOOD (ROUTINE X 2)
Culture: NO GROWTH
Culture: NO GROWTH
Special Requests: ADEQUATE
Special Requests: ADEQUATE

## 2019-12-23 MED ORDER — SODIUM CHLORIDE 0.9 % IV SOLN
2.0000 g | Freq: Every day | INTRAVENOUS | Status: DC
  Administered 2019-12-23: 16:00:00 2 g via INTRAVENOUS
  Filled 2019-12-23: qty 1.88
  Filled 2019-12-23: qty 20

## 2019-12-23 NOTE — Plan of Care (Signed)
  Problem: Clinical Measurements: Goal: Ability to maintain clinical measurements within normal limits will improve Outcome: Progressing Goal: Diagnostic test results will improve Outcome: Progressing Goal: Cardiovascular complication will be avoided Outcome: Progressing   Problem: Activity: Goal: Risk for activity intolerance will decrease Outcome: Progressing   Problem: Nutrition: Goal: Adequate nutrition will be maintained Outcome: Progressing   Problem: Elimination: Goal: Will not experience complications related to bowel motility Outcome: Progressing Goal: Will not experience complications related to urinary retention Outcome: Progressing   Problem: Pain Managment: Goal: General experience of comfort will improve Outcome: Progressing   Problem: Safety: Goal: Ability to remain free from injury will improve Outcome: Progressing   Problem: Skin Integrity: Goal: Risk for impaired skin integrity will decrease Outcome: Progressing

## 2019-12-23 NOTE — Progress Notes (Signed)
PMT brief progress note  Patient was seen briefly on 12-21 and also this am, she is awake alert, sitting up in bed. In no distress. Chart reviewed, discussed with TRH MD on 12-21.  BP 121/72 (BP Location: Right Arm)   Pulse (!) 113   Temp 98.1 F (36.7 C)   Resp 18   Ht 5' 6.5" (1.689 m)   Wt 69 kg   SpO2 94%   BMI 24.18 kg/m  Labs and imaging noted.  TOC documentation noted.  It appears that arrangements are being made for home with hospice. Patient states that her friend [name] is getting her house ready. Patient is anticipating discharge soon.  No additional PMT specific recommendations at this time, agree with home with hospice and then would recommend residential hospice for symptom management as the patient's condition progresses.  64 year old lady with life limiting illness of squamous cell carcinoma of the cervix with bone metastases and peritoneal carcinomatosis, admitted with acute bacterial peritonitis with malignant ascites, patient evaluated by interventional radiology, placed on antibiotics. 15 minutes spent. Greater than 50% of this time was spent counseling and coordinating care related to the above assessment and plan. PPS 40% [name] MD  [place] [facility].

## 2019-12-23 NOTE — Progress Notes (Signed)
PROGRESS NOTE    Briahnna Harries  JKD:[id number] DOB: 11-19-1955 DOA: 12/18/2019 PCP: Patient, No Pcp Per    Chief Complaint  Patient presents with  . Weakness    Brief Narrative:  64 year old lady with prior history of metastatic cervical cancer with mets to bone and peritoneal carcinomatosis, hypothyroidism, GERD presents to ED with generalized weakness.  She recently underwent catheter placement for significant ascites for peritoneal carcinomatosis and recurrent ascites.  CT of the abdomen and pelvis shows moderate amount of abdominal pelvic ascites with diffuse peritoneal enhancement probably secondary to metastatic disease or peritonitis.  Additionally there is a 6.3 enhancing fluid collection in the deep posterior pelvic  She is currently being treated for acute peritonitis was started on IV Rocephin today.  Assessment & Plan:   Principal Problem:   Acute bacterial peritonitis (HCC) Active Problems:   Squamous cell carcinoma of cervix (HCC)   Peritoneal carcinomatosis (HCC)   Malignant ascites   Pelvic abscess in female   Cellulitis of abdominal wall   Goals of care, counseling/discussion   Hypothyroidism (acquired)   GERD without esophagitis   Acute bacterial peritonitis with malignant ascites S/p peritoneal catheter.  Peritoneal analysis shows bacterial peritonitis.  CT scan of the abdomen shows findings concerning for abscess in the deep pelvis. Interventional radiology recommended IV antibiotics due to immunocompromise status. Blood cultures have been negative so far Gram stain negative.    Peritoneal carcinomatosis Poor prognosis s/p peritoneal catheter placement Catheter drainage of abdominal distention versus.    Pelvic abscess Currently on IV antibiotics.  No intervention planned at this time.    Cellulitis of the abdominal wall  Currently on IV antibiotics    Squamous cell carcinoma of the cervix with bone mets and peritoneal  carcinomatosis Recommend outpatient follow-up with her oncologist.   Hypothyroidism Continue with Synthroid.   GERD Stable  In view of multiple medical problems, poor prognostic factors, clinical deterioration despite IV antibiotics recommend Residential hospice as patient is unable to take care of herself at home.  She will need 24-hour care and supervision.   DVT prophylaxis: SCDs Code Status: DNR  family Communication: None at bedside Disposition:   Status is: Inpatient  Remains inpatient appropriate because:IV treatments appropriate due to intensity of illness or inability to take PO   Dispo: The patient is from: Home              Anticipated d/c is to: Home              Anticipated d/c date is: > 3 days              Patient currently is not medically stable to d/c.       Consultants:   Palliative care.    Procedures: none   Antimicrobials: rocephin for possible peritonitis.    Subjective: Does not feel safe to go home byherself.   Objective: Vitals:   12/22/19 2040 12/23/19 0025 12/23/19 0401 12/23/19 1256  BP: 126/72 111/65 121/72 127/63  Pulse: 98 (!) 110 (!) 113 (!) 108  Resp: 18 18 18 18   Temp: 98.4 F (36.9 C) 99.5 F (37.5 C) 98.1 F (36.7 C)   TempSrc:      SpO2: 94% 95% 94% 97%  Weight:      Height:        Intake/Output Summary (Last 24 hours) at 12/23/2019 1808 Last data filed at 12/23/2019 1603 Gross per 24 hour  Intake 1119.86 ml  Output 1400 ml  Net -280.14  ml   Filed Weights   12/18/19 1420 12/19/19 0030  Weight: 71.7 kg 69 kg    Examination:  General exam: Appears calm and comfortable  Respiratory system: Clear to auscultation. Respiratory effort normal. Cardiovascular system: S1 & S2 heard, RRR. No JVD, murmurs, rubs, gallops or clicks. No pedal edema. Gastrointestinal system: Abdomen is distended, tender and erythematous on the lateral aspect on the right side.  Central nervous system: Alert and oriented. No focal  neurological deficits. Extremities: Symmetric 5 x 5 power. Skin: No rashes, lesions or ulcers Psychiatry: . Mood & affect appropriate.     Data Reviewed: I have personally reviewed following labs and imaging studies  CBC: Recent Labs  Lab 12/18/19 1455 12/19/19 0551 12/21/19 0510  WBC 10.4 9.1 9.5  NEUTROABS 8.3* 7.4  --   HGB 9.2* 8.1* 8.7*  HCT 28.9* 26.2* 28.9*  MCV 87.8 89.7 91.2  PLT 528* 508* 839*    Basic Metabolic Panel: Recent Labs  Lab 12/18/19 1455 12/19/19 0551 12/21/19 0510  NA 134* 137 138  K 3.5 3.3* 3.7  CL 100 105 107  CO2 21* 21* 21*  GLUCOSE 123* 106* 90  BUN 11 10 10   CREATININE 0.79 0.74 0.70  CALCIUM 8.6* 7.6* 7.3*  MG  --  1.8 1.7    GFR: Estimated Creatinine Clearance: 69.6 mL/min (by C-G formula based on SCr of 0.7 mg/dL).  Liver Function Tests: Recent Labs  Lab 12/18/19 1455 12/19/19 0551  AST 43* 30  ALT 28 23  ALKPHOS 197* 173*  BILITOT 0.4 0.4  PROT 6.0* 5.2*  ALBUMIN 2.3* 1.9*    CBG: Recent Labs  Lab 12/21/19 0930  GLUCAP 91     Recent Results (from the past 240 hour(s))  Blood culture (routine x 2)     Status: None   Collection Time: 12/18/19  2:45 PM   Specimen: BLOOD LEFT ARM  Result Value Ref Range Status   Specimen Description   Final    BLOOD LEFT ARM Performed at [facility], [address] [place], [place] [address]    Special Requests   Final    BOTTLES DRAWN AEROBIC AND ANAEROBIC Blood Culture adequate volume Performed at [facility], [address] [place], [place] [address]    Culture   Final    NO GROWTH 5 DAYS Performed at [facility], [place] [address], [place], [place] [address]    Report Status 12/23/2019 FINAL  Final  Blood culture (routine x 2)     Status: None   Collection Time: 12/18/19  2:55 PM   Specimen: BLOOD RIGHT ARM  Result Value Ref Range Status   Specimen Description   Final    BLOOD RIGHT ARM Performed at [facility], [address] [place], [place] [address]    Special Requests   Final    BOTTLES DRAWN AEROBIC AND ANAEROBIC Blood Culture adequate volume Performed at [facility], [address], [place], [place] [address]    Culture   Final    NO GROWTH 5 DAYS Performed at [facility], [place] [address] [place], [place] [address]    Report Status 12/23/2019 FINAL  Final  Culture, body fluid-bottle     Status: None   Collection Time: 12/18/19  4:00 PM   Specimen: Fluid  Result Value Ref Range Status   Specimen Description FLUID PERITONEAL  Final   Special Requests BOTTLES  DRAWN AEROBIC AND ANAEROBIC  Final   Culture   Final    NO GROWTH 5 DAYS Performed at [facility], [place] [address] [place], [place] [address]    Report Status 12/23/2019 FINAL  Final  Gram stain     Status: None   Collection Time: 12/18/19  4:00 PM   Specimen: Fluid  Result Value Ref Range Status   Specimen Description FLUID PERITONEAL  Final   Special Requests NONE  Final   Gram Stain   Final    MODERATE WBC PRESENT,BOTH PMN AND MONONUCLEAR NO ORGANISMS SEEN Performed at [facility], [place] [address], [place], [place] [address]    Report Status 12/18/2019 FINAL  Final  Resp Panel by RT-PCR (Flu A&B, Covid) Nasopharyngeal Swab     Status: None   Collection Time: 12/18/19  4:19 PM   Specimen: Nasopharyngeal Swab; Nasopharyngeal(NP) swabs in vial transport medium  Result Value Ref Range Status   SARS Coronavirus 2 by RT PCR NEGATIVE NEGATIVE Final    Comment: (NOTE) SARS-CoV-2 target nucleic acids are NOT DETECTED.  The SARS-CoV-2 RNA is generally detectable in upper respiratory specimens during the acute phase of infection. The lowest concentration of SARS-CoV-2 viral copies this assay can detect is 138 copies/mL. A negative result does not preclude SARS-Cov-2 infection and should not be used as the sole basis for treatment or other patient management decisions. A negative result may occur  with  improper specimen collection/handling, submission of specimen other than nasopharyngeal swab, presence of viral mutation(s) within the areas targeted by this assay, and inadequate number of viral copies(<138 copies/mL). A negative result must be combined with clinical observations, patient history, and epidemiological information. The expected result is Negative.  Fact Sheet for Patients:  EntrepreneurPulse.com.au  Fact Sheet for Healthcare Providers:  IncredibleEmployment.be  This test is no t yet approved or cleared by the [place] FDA and  has been authorized for detection and/or diagnosis of SARS-CoV-2 by FDA under an Emergency Use Authorization (EUA). This EUA will remain  in effect (meaning this test can be used) for the duration of the COVID-19 declaration under Section 564(b)(1) of the Act, 21 U.S.C.section 360bbb-3(b)(1), unless the authorization is terminated  or revoked sooner.       Influenza A by PCR NEGATIVE NEGATIVE Final   Influenza B by PCR NEGATIVE NEGATIVE Final    Comment: (NOTE) The Xpert Xpress SARS-CoV-2/FLU/RSV plus assay is intended as an aid in the diagnosis of influenza from Nasopharyngeal swab specimens and should not be used as a sole basis for treatment. Nasal washings and aspirates are unacceptable for Xpert Xpress SARS-CoV-2/FLU/RSV testing.  Fact Sheet for Patients: EntrepreneurPulse.com.au  Fact Sheet for Healthcare Providers: IncredibleEmployment.be  This test is not yet approved or cleared by the [place] FDA and has been authorized for detection and/or diagnosis of SARS-CoV-2 by FDA under an Emergency Use Authorization (EUA). This EUA will remain in effect (meaning this test can be used) for the duration of the COVID-19 declaration under Section 564(b)(1) of the Act, 21 U.S.C. section 360bbb-3(b)(1), unless the authorization is terminated  or revoked.  Performed at [facility], [address] [place], [place] [address]   Urine culture     Status: Abnormal   Collection Time: 12/18/19  8:30 PM   Specimen: In/Out Cath Urine  Result Value Ref Range Status   Specimen Description   Final    IN/OUT CATH URINE Performed at [facility],  [address], [place], [place] [address]    Special Requests   Final    NONE Performed at [facility], [address] [place], [place] [address]    Culture 300 COLONIES/mL YEAST (A)  Final   Report Status 12/21/2019 FINAL  Final         Radiology Studies: No results found.      Scheduled Meds: . guaiFENesin  600 mg Oral BID  . levothyroxine  125 mcg Oral Q0600  . pantoprazole  40 mg Oral Daily   Continuous Infusions: . cefTRIAXone (ROCEPHIN)  IV 2 g (12/23/19 1603)     LOS: 4 days        Kathlen Mody, MD [facility]   To contact the attending provider between 7A-7P or the covering provider during after hours 7P-7A, please log into the web site [email or web address] and access using universal [place] password for that web site. If you do not have the password, please call the hospital operator.  12/23/2019, 6:08 PM

## 2019-12-23 NOTE — Plan of Care (Signed)
  Problem: Clinical Measurements: Goal: Ability to maintain clinical measurements within normal limits will improve Outcome: Progressing Goal: Will remain free from infection Outcome: Progressing Goal: Diagnostic test results will improve Outcome: Progressing Goal: Cardiovascular complication will be avoided Outcome: Progressing   Problem: Activity: Goal: Risk for activity intolerance will decrease Outcome: Progressing   Problem: Nutrition: Goal: Adequate nutrition will be maintained Outcome: Progressing   Problem: Elimination: Goal: Will not experience complications related to bowel motility Outcome: Progressing Goal: Will not experience complications related to urinary retention Outcome: Progressing   Problem: Pain Managment: Goal: General experience of comfort will improve Outcome: Progressing   Problem: Safety: Goal: Ability to remain free from injury will improve Outcome: Progressing   Problem: Skin Integrity: Goal: Risk for impaired skin integrity will decrease Outcome: Progressing

## 2019-12-24 LAB — COMPREHENSIVE METABOLIC PANEL
ALT: 12 U/L (ref 0–44)
AST: 20 U/L (ref 15–41)
Albumin: 1.9 g/dL — ABNORMAL LOW (ref 3.5–5.0)
Alkaline Phosphatase: 180 U/L — ABNORMAL HIGH (ref 38–126)
Anion gap: 11 (ref 5–15)
BUN: 6 mg/dL — ABNORMAL LOW (ref 8–23)
CO2: 23 mmol/L (ref 22–32)
Calcium: 7.3 mg/dL — ABNORMAL LOW (ref 8.9–10.3)
Chloride: 105 mmol/L (ref 98–111)
Creatinine, Ser: 0.57 mg/dL (ref 0.44–1.00)
GFR, Estimated: >60 mL/min (ref >60)
Glucose, Bld: 113 mg/dL — ABNORMAL HIGH (ref 70–99)
Potassium: 3 mmol/L — ABNORMAL LOW (ref 3.5–5.1)
Sodium: 139 mmol/L (ref 135–145)
Total Bilirubin: 0.4 mg/dL (ref 0.3–1.2)
Total Protein: 5.2 g/dL — ABNORMAL LOW (ref 6.5–8.1)

## 2019-12-24 LAB — CBC
HCT: 29.9 % — ABNORMAL LOW (ref 36.0–46.0)
Hemoglobin: 9.2 g/dL — ABNORMAL LOW (ref 12.0–15.0)
MCH: 27.5 pg (ref 26.0–34.0)
MCHC: 30.8 g/dL (ref 30.0–36.0)
MCV: 89.5 fL (ref 80.0–100.0)
Platelets: 1506 10*3/uL (ref 150–400)
RBC: 3.34 MIL/uL — ABNORMAL LOW (ref 3.87–5.11)
RDW: 15.3 % (ref 11.5–15.5)
WBC: 14.1 10*3/uL — ABNORMAL HIGH (ref 4.0–10.5)
nRBC: 0 % (ref 0.0–0.2)

## 2019-12-24 LAB — PATHOLOGIST SMEAR REVIEW

## 2019-12-24 MED ORDER — POTASSIUM CHLORIDE CRYS ER 20 MEQ PO TBCR
40.0000 meq | EXTENDED_RELEASE_TABLET | Freq: Two times a day (BID) | ORAL | Status: DC
  Administered 2019-12-24: 12:00:00 40 meq via ORAL
  Filled 2019-12-24: qty 2

## 2019-12-24 MED ORDER — CIPROFLOXACIN HCL 500 MG PO TABS: 500.0000 mg | ORAL_TABLET | Freq: Two times a day (BID) | ORAL | 0 refills | Status: DC

## 2019-12-24 MED ORDER — CIPROFLOXACIN HCL 500 MG PO TABS
500.0000 mg | ORAL_TABLET | Freq: Two times a day (BID) | ORAL | Status: DC
  Administered 2019-12-24: 12:00:00 500 mg via ORAL
  Filled 2019-12-24: qty 1

## 2019-12-24 MED ORDER — OXYCODONE-ACETAMINOPHEN 5-325 MG PO TABS: 1.0000 | ORAL_TABLET | Freq: Four times a day (QID) | ORAL | 0 refills | Status: DC | PRN

## 2019-12-24 MED ORDER — ACETAMINOPHEN 325 MG PO TABS: 650.0000 mg | ORAL_TABLET | Freq: Four times a day (QID) | ORAL | Status: DC | PRN

## 2019-12-24 NOTE — Progress Notes (Signed)
Pt to be discharged to home this afternoon. Pt's AVS reviewed and discharge Medications reviewed and the schedules for these Medications. Pt verbalized understanding of all discharge teaching. AVS with Pt at time of discharge

## 2019-12-24 NOTE — Progress Notes (Signed)
   Pt friend Elpidio Eric reports that they have cleaned out the pt's main level at her home in [facility] for her to return to when ready for d/c.  I have ordered equipment of hospital bed and [employer] [facility] for delivery.   The pt has been approved for hospice care at home. She will go home by car.   Webb Silversmith RN [phone]

## 2019-12-24 NOTE — Progress Notes (Signed)
MD rounding made aware of today's lab results. Maintain plan of care for the Pt,

## 2019-12-24 NOTE — TOC Transition Note (Signed)
Transition of Care [facility]) - CM/SW Discharge Note   Patient Details  Name: Laura Woodward MRN: [id number] Date of Birth: 04/19/1955  Transition of Care [facility]) CM/SW Contact:  Dessa Phi, RN Phone Number: 12/24/2019, 11:41 AM   Clinical Narrative:Noted request for eval of residential hospice-patient is taking po's-she was evaluated a few days ago-per [facility] she would not qualify for residential hospice-also spoke to Sonya-d/c plan is hme w/hospice services-they already have dme in the home.[facility] request patient be in the home by 2p-MD/Nsg notified-Family friend Doree Albee tel#[phone] will transport home. No further CM needs.       Final next level of care: Home w Hospice Care Barriers to Discharge: No Barriers Identified   Patient Goals and CMS Choice Patient states their goals for this hospitalization and ongoing recovery are:: to go home with hospice CMS Medicare.gov Compare Post Acute Care list provided to:: Patient Represenative (must comment) Choice offered to / list presented to : New Home / [place]  Discharge Placement                       Discharge Plan and Services   Discharge Planning Services: CM Consult Post Acute Care Choice: Hospice          DME Arranged: N/A         HH Arranged: RN Gail Date [place]: 12/24/19 Time [place]: 46 Representative spoke with at [place]: Cheri  Social Determinants of Health ([place]) Interventions     Readmission Risk Interventions No flowsheet data found.

## 2019-12-24 NOTE — Progress Notes (Signed)
PMT  progress note  Patient resting in bed, she states that she is feeling ok this morning, appears weak.  Chart reviewed, discussed with TRH MD and RN. Patient with low BPs this morning.   BP (!) 91/53   Pulse 89   Temp 97.9 F (36.6 C) (Oral)   Resp 18   Ht 5' 6.5" (1.689 m)   Wt 69 kg   SpO2 96%   BMI 24.18 kg/m  Labs and imaging noted.  TOC documentation noted.    64 year old lady with life limiting illness of squamous cell carcinoma of the cervix with bone metastases and peritoneal carcinomatosis, admitted with acute bacterial peritonitis with malignant ascites, patient evaluated by interventional radiology, placed on antibiotics. Recommend residential hospice versus home with hospice once medically stable for discharge, no additional PMT specific recommendations.  25 minutes spent. Greater than 50% of this time was spent counseling and coordinating care related to the above assessment and plan. PPS 40% [name] MD  [place] palliative.

## 2019-12-26 NOTE — Discharge Summary (Signed)
Physician Discharge Summary  Laura Woodward EXH:[id number] DOB: [birth year] DOA: 12/18/2019  PCP: Laura Woodward, No Pcp Per  Admit date: 12/18/2019 Discharge date: 12/24/2019  Admitted From: Home.  Disposition:  Home Hospice.   Recommendations for Outpatient Follow-up:  1. Follow up with PCP in 1-2 weeks 2. Please obtain BMP/CBC in one week Please follow up with home hospice.   Discharge Condition:Hospice.  CODE STATUS: DNR.  Diet recommendation: Heart Healthy   Brief/Interim Summary:  64 year old lady with prior history of metastatic cervical cancer with mets to bone and peritoneal carcinomatosis, hypothyroidism, GERD presents to ED with generalized weakness.  She recently underwent catheter placement for significant ascites for peritoneal carcinomatosis and recurrent ascites.  CT of the abdomen and pelvis shows moderate amount of abdominal pelvic ascites with diffuse peritoneal enhancement probably secondary to metastatic disease or peritonitis.  Additionally there is a 6.3 enhancing fluid collection in the deep posterior pelvic  She is currently being treated for acute peritonitis was started on IV Rocephin today, but pt refused, and she was transitioned to oral ciprofloxacin.   Discharge Diagnoses:  Principal Problem:   Acute bacterial peritonitis ([place]) Active Problems:   Squamous cell carcinoma of cervix ([place])   Peritoneal carcinomatosis ([facility])   Malignant ascites   Pelvic abscess in female   Cellulitis of abdominal wall   Goals of care, counseling/discussion   Hypothyroidism (acquired)   GERD without esophagitis    Acute bacterial peritonitis with malignant ascites S/p peritoneal catheter.   CT scan of the abdomen shows findings concerning for abscess in the deep pelvis. Interventional radiology recommended IV antibiotics due to immunocompromise status. Blood cultures have been negative so far, Gram stain negative. Started her on IV antibiotics, but pt adamant about going home  today, despite low BP parameters. She wanted to go home with hospice.  Transitioned to oral antibiotics to complete the course.     Peritoneal carcinomatosis Poor prognosis s/p peritoneal catheter placement Catheter drainage of abdominal distention .    Pelvic abscess   No intervention planned at this time.    Cellulitis of the abdominal wall  Improving with antibiotics.     Squamous cell carcinoma of the cervix with bone mets and peritoneal carcinomatosis Recommend outpatient follow-up with her oncologist.   Hypothyroidism Continue with Synthroid.   GERD Stable  Hypokalemia:  Replaced.   In view of multiple medical problems, poor prognostic factors, clinical deterioration despite IV antibiotics recommend Residential hospice as Laura Woodward is unable to take care of herself at home.  She will need 24-hour care and supervision. But pt adamant and wanted to go home with hospice.   Discharge Instructions  Discharge Instructions    Diet - low sodium heart healthy   Complete by: As directed    No wound care   Complete by: As directed      Allergies as of 12/24/2019      Reactions   Paclitaxel Itching   Flushing, redness    Sulfa Antibiotics    Leg swelling   Ceftriaxone Nausea And Vomiting      Medication List    STOP taking these medications   cephALEXin 500 MG capsule Commonly known as: KEFLEX   dexamethasone 4 MG tablet Commonly known as: DECADRON   gabapentin 300 MG capsule Commonly known as: NEURONTIN   hydrocortisone 2.5 % rectal cream Commonly known as: ANUSOL-HC   lidocaine 2 % jelly Commonly known as: XYLOCAINE   mirabegron ER 50 MG Tb24 tablet Commonly known as: MYRBETRIQ  Muscle Rub 10-15 % Crea   naproxen 250 MG tablet Commonly known as: NAPROSYN   nitrofurantoin (macrocrystal-monohydrate) 100 MG capsule Commonly known as: MACROBID   oxybutynin 5 MG 24 hr tablet Commonly known as: DITROPAN-XL    prochlorperazine 10 MG tablet Commonly known as: COMPAZINE   tiZANidine 2 MG tablet Commonly known as: ZANAFLEX   trospium 20 MG tablet Commonly known as: SANCTURA     TAKE these medications   acetaminophen 325 MG tablet Commonly known as: TYLENOL Take 2 tablets (650 mg total) by mouth every 6 (six) hours as needed for mild pain (or Fever >/= 101). What changed:   medication strength  how much to take  when to take this  reasons to take this   ciprofloxacin 500 MG tablet Commonly known as: CIPRO Take 1 tablet (500 mg total) by mouth 2 (two) times daily for 7 days. Notes to Laura Woodward: Last Dose of this Medication was given on 12/24/2019 at 11:44 am Please take a second dose of this Medication this evening   levothyroxine 125 MCG tablet Commonly known as: SYNTHROID Take 125 mcg by mouth daily. Notes to Laura Woodward: Last Dose of this Medication was given on 12/24/2019 at 5:05 am   oxyCODONE-acetaminophen 5-325 MG tablet Commonly known as: PERCOCET/ROXICET Take 1 tablet by mouth every 6 (six) hours as needed for up to 3 days for moderate pain or severe pain.   pantoprazole 40 MG tablet Commonly known as: PROTONIX Take by mouth. Notes to Laura Woodward: Last Dose of this Medication was given on 12/24/2019 at 9:38 am    Potassium Chloride ER 20 MEQ Tbcr Take 20 mEq by mouth daily for 10 days. Notes to Laura Woodward: Last Dose of this Medication was given on 12/24/2019 at 11:44 am   traZODone 50 MG tablet Commonly known as: DESYREL Take 50 mg by mouth at bedtime as needed for sleep.       Allergies  Allergen Reactions  . Paclitaxel Itching    Flushing, redness   . Sulfa Antibiotics     Leg swelling  . Ceftriaxone Nausea And Vomiting    Consultations:  Palliative care.    Procedures/Studies: DG Chest 1 View  Result Date: 12/19/2019 CLINICAL DATA:  Pulmonary edema. EXAM: CHEST  1 VIEW COMPARISON:  12/18/2019 FINDINGS: Heart size remains within normal limits. Low lung  volumes are again seen. Bibasilar infiltrates are again seen, with probable mild worsening in the left lung base when allowing for changes in positioning. Tiny pleural effusions cannot be excluded. IMPRESSION: Bibasilar infiltrates, with probable mild worsening in the left lung base. Electronically Signed   By: Danae OrleansJohn A Stahl M.D.   On: 12/19/2019 05:01   CT ABDOMEN PELVIS W CONTRAST  Result Date: 12/18/2019 CLINICAL DATA:  Abdomen distension history of cervical cancer EXAM: CT ABDOMEN AND PELVIS WITH CONTRAST TECHNIQUE: Multidetector CT imaging of the abdomen and pelvis was performed using the standard protocol following bolus administration of intravenous contrast. CONTRAST:  100mL OMNIPAQUE IOHEXOL 300 MG/ML  SOLN COMPARISON:  None. FINDINGS: Lower chest: Lung bases demonstrate small bilateral pleural effusions and dependent lower lobe atelectasis. Borderline cardiomegaly. Small hiatal hernia. Hepatobiliary: No focal hepatic abnormality. No calcified gallstone or biliary dilatation Pancreas: Unremarkable. No pancreatic ductal dilatation or surrounding inflammatory changes. Spleen: Normal in size without focal abnormality. Adrenals/Urinary Tract: Adrenal glands are normal. Ureteral stent within the left kidney. There is mild bilateral hydronephrosis. Abnormal appearance of the bladder with irregular areas of wall thickening. Masslike thickening of the posterior wall of  the bladder with soft tissue density near the left distal pigtail. Stomach/Bowel: The stomach is nonenlarged. Fluid-filled nondistended small bowel. No acute colon wall thickening. Vascular/Lymphatic: Nonaneurysmal aorta. Subcentimeter Peri aortic lymph nodes. Mildly enlarged bilateral nodes adjacent to the proximal external iliac arteries. On the right, this measures 11 mm and on the left, measures 14 mm. Reproductive: Fluid-filled structure within the central pelvis which appears contiguous with the vagina and presumably represents an  obstructed uterus. Fluid visible within the vaginal cuff. Irregular soft tissue density in the region of the cervix which appears contiguous with irregular soft tissue density at the posterior aspect of the bladder concerning for bladder invasion. Possible dilated right fallopian tube. Other: No free air. Abdominal drainage catheter in the right lower quadrant with the tip terminating in the left mid abdomen. Moderate abdominal ascites and pelvic ascites. Nodular infiltration of the left upper quadrant mesentery and the omentum, consistent with peritoneal metastatic disease. Mild diffuse peritoneal enhancement. Rim enhancing loculated fluid collections within the pelvis. The largest collection is seen posteriorly and measures 6.3 x 3.2 cm. Musculoskeletal: Sclerotic lesions within the left iliac bone, right sacrum and left ischium, concerning for metastatic disease. IMPRESSION: 1. Moderate abdominal and pelvic ascites with peritoneal drainage catheter in place. Mild diffuse peritoneal enhancement could be secondary to metastatic disease or peritonitis. Loculated/mildly rim enhancing fluid collection within the deep posterior pelvis measuring 6.3 cm. 2. Irregular soft tissue density in the region of the cervix, appears contiguous with lobulated soft tissue density at the posterior wall of the bladder concerning for invasion. There are other areas of irregular bladder wall thickening, concerning for neoplastic involvement. Nodular infiltration of the omentum and left upper quadrant mesentery consistent with peritoneal metastatic disease. Left ureteral stent in place. There is mild bilateral hydronephrosis, suspect secondary to mild obstruction from soft tissue mass at the posterior aspect of the bladder. 3. Fluid-filled structure within the central pelvis, contiguous with fluid-filled vagina presumably represents an obstructed uterus. 4. Small bilateral pleural effusions. 5. Metastatic disease of the pelvic bones.  Enlarged pelvic lymph nodes consistent with metastatic disease Electronically Signed   By: Jasmine Pang M.D.   On: 12/18/2019 18:01   DG Chest Portable 1 View  Result Date: 12/18/2019 CLINICAL DATA:  Chest pain, cough and weakness. EXAM: PORTABLE CHEST 1 VIEW COMPARISON:  03/10/2018 FINDINGS: Poor inspiration. Hazy/patchy infiltrates in the lower lungs. Small pleural effusion on the right. Findings consistent with bibasilar pneumonia. No dense consolidation or lobar collapse. No significant bone finding. IMPRESSION: Hazy/patchy infiltrates in the lower lungs consistent with bibasilar pneumonia. Small right effusion. Electronically Signed   By: Paulina Fusi M.D.   On: 12/18/2019 16:00       Subjective: Pt adamant about going home despte low bp parameters.   Discharge Exam: Vitals:   12/24/19 0414 12/24/19 0759  BP: (!) 87/62 (!) 91/53  Pulse: 90 89  Resp: 18   Temp: 97.9 F (36.6 C)   SpO2: 96% 96%   Vitals:   12/23/19 2037 12/24/19 0017 12/24/19 0414 12/24/19 0759  BP: 124/66 (!) 87/56 (!) 87/62 (!) 91/53  Pulse: (!) 111 84 90 89  Resp: Temp: 98.7 F (37.1 C) 97.9 F (36.6 C) 97.9 F (36.6 C)   TempSrc:   Oral   SpO2: 96% 95% 96% 96%  Weight:      Height:        General: Pt is alert, awake, not in acute distress Cardiovascular: RRR, S1/S2 +,  no rubs, no gallops Respiratory: CTA bilaterally, no wheezing, no rhonchi Abdominal: Soft, NT, ND, bowel sounds + Extremities: no edema, no cyanosis    The results of significant diagnostics from this hospitalization (including imaging, microbiology, ancillary and laboratory) are listed below for reference.     Microbiology: Recent Results (from the past 240 hour(s))  Blood culture (routine x 2)     Status: None   Collection Time: 12/18/19  2:45 PM   Specimen: BLOOD LEFT ARM  Result Value Ref Range Status   Specimen Description   Final    BLOOD LEFT ARM Performed at [facility], [address] [place], [place] [address]    Special Requests   Final    BOTTLES DRAWN AEROBIC AND ANAEROBIC Blood Culture adequate volume Performed at [facility], [address] [place], [place] [address]    Culture   Final    NO GROWTH 5 DAYS Performed at [facility], [address], [place], [place] [address]    Report Status 12/23/2019 FINAL  Final  Blood culture (routine x 2)     Status: None   Collection Time: 12/18/19  2:55 PM   Specimen: BLOOD RIGHT ARM  Result Value Ref Range Status   Specimen Description   Final    BLOOD RIGHT ARM Performed at [facility], [address] [place], [place] [address]    Special Requests   Final    BOTTLES DRAWN AEROBIC AND ANAEROBIC Blood Culture adequate volume Performed at [facility], [address], [place], [place] [address]    Culture   Final    NO GROWTH 5 DAYS Performed at [facility], [address], [place], [place] [address]    Report Status 12/23/2019 FINAL  Final  Culture, body fluid-bottle     Status: None   Collection Time: 12/18/19  4:00 PM   Specimen: Fluid  Result Value Ref Range Status   Specimen Description FLUID PERITONEAL  Final   Special Requests BOTTLES DRAWN AEROBIC AND ANAEROBIC  Final   Culture   Final    NO GROWTH 5 DAYS Performed at [facility], [address], [place], [place] [address]    Report Status 12/23/2019 FINAL  Final  Gram stain     Status: None   Collection Time: 12/18/19  4:00 PM   Specimen: Fluid  Result Value Ref Range Status   Specimen Description FLUID PERITONEAL  Final   Special Requests NONE  Final   Gram Stain   Final    MODERATE WBC PRESENT,BOTH PMN AND MONONUCLEAR NO ORGANISMS SEEN Performed at [facility], [address], [place], [place] [address]    Report Status 12/18/2019 FINAL  Final  Resp Panel by RT-PCR (Flu A&B, Covid) Nasopharyngeal Swab     Status: None   Collection Time: 12/18/19  4:19 PM   Specimen:  Nasopharyngeal Swab; Nasopharyngeal(NP) swabs in vial transport medium  Result Value Ref Range Status   SARS Coronavirus 2 by RT PCR NEGATIVE NEGATIVE Final    Comment: (NOTE) SARS-CoV-2 target nucleic acids are NOT DETECTED.  The SARS-CoV-2 RNA is generally detectable in upper respiratory specimens during the acute phase of infection. The lowest concentration of SARS-CoV-2 viral copies this assay can detect is 138 copies/mL. A negative result does not preclude SARS-Cov-2 infection and should not be used as the sole basis for treatment or other Laura Woodward management  decisions. A negative result may occur with  improper specimen collection/handling, submission of specimen other than nasopharyngeal swab, presence of viral mutation(s) within the areas targeted by this assay, and inadequate number of viral copies(<138 copies/mL). A negative result must be combined with clinical observations, Laura Woodward history, and epidemiological information. The expected result is Negative.  Fact Sheet for Patients:  BloggerCourse.com  Fact Sheet for Healthcare Providers:  SeriousBroker.it  This test is no t yet approved or cleared by the [place] FDA and  has been authorized for detection and/or diagnosis of SARS-CoV-2 by FDA under an Emergency Use Authorization (EUA). This EUA will remain  in effect (meaning this test can be used) for the duration of the COVID-19 declaration under Section 564(b)(1) of the Act, 21 U.S.C.section 360bbb-3(b)(1), unless the authorization is terminated  or revoked sooner.       Influenza A by PCR NEGATIVE NEGATIVE Final   Influenza B by PCR NEGATIVE NEGATIVE Final    Comment: (NOTE) The Xpert Xpress SARS-CoV-2/FLU/RSV plus assay is intended as an aid in the diagnosis of influenza from Nasopharyngeal swab specimens and should not be used as a sole basis for treatment. Nasal washings and aspirates are unacceptable for  Xpert Xpress SARS-CoV-2/FLU/RSV testing.  Fact Sheet for Patients: BloggerCourse.com  Fact Sheet for Healthcare Providers: SeriousBroker.it  This test is not yet approved or cleared by the [place] FDA and has been authorized for detection and/or diagnosis of SARS-CoV-2 by FDA under an Emergency Use Authorization (EUA). This EUA will remain in effect (meaning this test can be used) for the duration of the COVID-19 declaration under Section 564(b)(1) of the Act, 21 U.S.C. section 360bbb-3(b)(1), unless the authorization is terminated or revoked.  Performed at [facility], [address] [place], [place] [address]   Urine culture     Status: Abnormal   Collection Time: 12/18/19  8:30 PM   Specimen: In/Out Cath Urine  Result Value Ref Range Status   Specimen Description   Final    IN/OUT CATH URINE Performed at [facility], [address] [place], [place] [address]    Special Requests   Final    NONE Performed at [facility], [address] [place], [place] [address]    Culture 300 COLONIES/mL YEAST (A)  Final   Report Status 12/21/2019 FINAL  Final     Labs: BNP (last 3 results) No results for input(s): BNP in the last 8760 hours. Basic Metabolic Panel: Recent Labs  Lab 12/21/19 0510 12/24/19 0925  NA 138 139  K 3.7 3.0*  CL 107 105  CO2 21* 23  GLUCOSE 90 113*  BUN 10 6*  CREATININE 0.70 0.57  CALCIUM 7.3* 7.3*  MG 1.7  --    Liver Function Tests: Recent Labs  Lab 12/24/19 0925  AST 20  ALT 12  ALKPHOS 180*  BILITOT 0.4  PROT 5.2*  ALBUMIN 1.9*   No results for input(s): LIPASE, AMYLASE in the last 168 hours. No results for input(s): AMMONIA in the last 168 hours. CBC: Recent Labs  Lab 12/21/19 0510 12/24/19 0925  WBC 9.5 14.1*  HGB 8.7* 9.2*  HCT 28.9* 29.9*  MCV 91.2 89.5  PLT 839* 1,506*   Cardiac Enzymes: No results for input(s): CKTOTAL, CKMB,  CKMBINDEX, TROPONINI in the last 168 hours. BNP: Invalid input(s): POCBNP CBG: Recent Labs  Lab 12/21/19 0930  GLUCAP 91   D-Dimer No results for input(s): DDIMER in the  last 72 hours. Hgb A1c No results for input(s): HGBA1C in the last 72 hours. Lipid Profile No results for input(s): CHOL, HDL, LDLCALC, TRIG, CHOLHDL, LDLDIRECT in the last 72 hours. Thyroid function studies No results for input(s): TSH, T4TOTAL, T3FREE, THYROIDAB in the last 72 hours.  Invalid input(s): FREET3 Anemia work up No results for input(s): VITAMINB12, FOLATE, FERRITIN, TIBC, IRON, RETICCTPCT in the last 72 hours. Urinalysis    Component Value Date/Time   COLORURINE YELLOW 12/18/2019 2030   APPEARANCEUR CLEAR 12/18/2019 2030   LABSPEC <1.005 (L) 12/18/2019 2030   PHURINE 5.0 12/18/2019 2030   GLUCOSEU NEGATIVE 12/18/2019 2030   HGBUR MODERATE (A) 12/18/2019 2030   BILIRUBINUR NEGATIVE 12/18/2019 2030   KETONESUR NEGATIVE 12/18/2019 2030   PROTEINUR NEGATIVE 12/18/2019 2030   NITRITE POSITIVE (A) 12/18/2019 2030   LEUKOCYTESUR NEGATIVE 12/18/2019 2030   Sepsis Labs Invalid input(s): PROCALCITONIN,  WBC,  LACTICIDVEN Microbiology Recent Results (from the past 240 hour(s))  Blood culture (routine x 2)     Status: None   Collection Time: 12/18/19  2:45 PM   Specimen: BLOOD LEFT ARM  Result Value Ref Range Status   Specimen Description   Final    BLOOD LEFT ARM Performed at [facility], [address] [place], [place] [address]    Special Requests   Final    BOTTLES DRAWN AEROBIC AND ANAEROBIC Blood Culture adequate volume Performed at [facility], [address] [place], [place] [address]    Culture   Final    NO GROWTH 5 DAYS Performed at [facility], [address], [place], [place] [address]    Report Status 12/23/2019 FINAL  Final  Blood culture (routine x 2)     Status: None   Collection Time: 12/18/19  2:55 PM   Specimen: BLOOD RIGHT ARM  Result  Value Ref Range Status   Specimen Description   Final    BLOOD RIGHT ARM Performed at [facility], [address] [place], [place] [address]    Special Requests   Final    BOTTLES DRAWN AEROBIC AND ANAEROBIC Blood Culture adequate volume Performed at [facility], [address] [place], [place] [address]    Culture   Final    NO GROWTH 5 DAYS Performed at [facility], [address] [place], [place] [address]    Report Status 12/23/2019 FINAL  Final  Culture, body fluid-bottle     Status: None   Collection Time: 12/18/19  4:00 PM   Specimen: Fluid  Result Value Ref Range Status   Specimen Description FLUID PERITONEAL  Final   Special Requests BOTTLES DRAWN AEROBIC AND ANAEROBIC  Final   Culture   Final    NO GROWTH 5 DAYS Performed at [facility], [address] [place], [place] [address]    Report Status 12/23/2019 FINAL  Final  Gram stain     Status: None   Collection Time: 12/18/19  4:00 PM   Specimen: Fluid  Result Value Ref Range Status   Specimen Description FLUID PERITONEAL  Final   Special Requests NONE  Final   Gram Stain   Final    MODERATE WBC PRESENT,BOTH PMN AND MONONUCLEAR NO ORGANISMS SEEN Performed at [facility], [address], [place], [place] [address]    Report Status 12/18/2019 FINAL  Final  Resp Panel by RT-PCR (Flu A&B, Covid) Nasopharyngeal Swab  Status: None   Collection Time: 12/18/19  4:19 PM   Specimen: Nasopharyngeal Swab; Nasopharyngeal(NP) swabs in vial transport medium  Result Value Ref Range Status   SARS Coronavirus 2 by RT PCR NEGATIVE NEGATIVE Final    Comment: (NOTE) SARS-CoV-2 target nucleic acids are NOT DETECTED.  The SARS-CoV-2 RNA is generally detectable in upper respiratory specimens during the acute phase of infection. The lowest concentration of SARS-CoV-2 viral copies this assay can detect is 138 copies/mL. A negative result does not preclude SARS-Cov-2 infection and  should not be used as the sole basis for treatment or other Laura Woodward management decisions. A negative result may occur with  improper specimen collection/handling, submission of specimen other than nasopharyngeal swab, presence of viral mutation(s) within the areas targeted by this assay, and inadequate number of viral copies(<138 copies/mL). A negative result must be combined with clinical observations, Laura Woodward history, and epidemiological information. The expected result is Negative.  Fact Sheet for Patients:  EntrepreneurPulse.com.au  Fact Sheet for Healthcare Providers:  IncredibleEmployment.be  This test is no t yet approved or cleared by the [place] FDA and  has been authorized for detection and/or diagnosis of SARS-CoV-2 by FDA under an Emergency Use Authorization (EUA). This EUA will remain  in effect (meaning this test can be used) for the duration of the COVID-19 declaration under Section 564(b)(1) of the Act, 21 U.S.C.section 360bbb-3(b)(1), unless the authorization is terminated  or revoked sooner.       Influenza A by PCR NEGATIVE NEGATIVE Final   Influenza B by PCR NEGATIVE NEGATIVE Final    Comment: (NOTE) The Xpert Xpress SARS-CoV-2/FLU/RSV plus assay is intended as an aid in the diagnosis of influenza from Nasopharyngeal swab specimens and should not be used as a sole basis for treatment. Nasal washings and aspirates are unacceptable for Xpert Xpress SARS-CoV-2/FLU/RSV testing.  Fact Sheet for Patients: EntrepreneurPulse.com.au  Fact Sheet for Healthcare Providers: IncredibleEmployment.be  This test is not yet approved or cleared by the [place] FDA and has been authorized for detection and/or diagnosis of SARS-CoV-2 by FDA under an Emergency Use Authorization (EUA). This EUA will remain in effect (meaning this test can be used) for the duration of the COVID-19 declaration  under Section 564(b)(1) of the Act, 21 U.S.C. section 360bbb-3(b)(1), unless the authorization is terminated or revoked.  Performed at [facility], [address], [place], [place] [address]   Urine culture     Status: Abnormal   Collection Time: 12/18/19  8:30 PM   Specimen: In/Out Cath Urine  Result Value Ref Range Status   Specimen Description   Final    IN/OUT CATH URINE Performed at [facility], [address] [place], [place] [address]    Special Requests   Final    NONE Performed at [facility], [address] [place], [place] [address]    Culture 300 COLONIES/mL YEAST (A)  Final   Report Status 12/21/2019 FINAL  Final     Time coordinating discharge: Over 34 minutes SIGNED:   Hosie Poisson, MD  [facility]

## 2019-12-27 ENCOUNTER — Other Ambulatory Visit: Payer: Self-pay

## 2019-12-27 ENCOUNTER — Emergency Department (HOSPITAL_BASED_OUTPATIENT_CLINIC_OR_DEPARTMENT_OTHER): Payer: BC Managed Care – PPO

## 2019-12-27 ENCOUNTER — Observation Stay (HOSPITAL_BASED_OUTPATIENT_CLINIC_OR_DEPARTMENT_OTHER)
Admission: EM | Admit: 2019-12-27 | Discharge: 2019-12-28 | Disposition: A | Discharge status: Hospice | Payer: BC Managed Care – PPO | Attending: Internal Medicine | Admitting: Internal Medicine

## 2019-12-27 ENCOUNTER — Encounter (HOSPITAL_BASED_OUTPATIENT_CLINIC_OR_DEPARTMENT_OTHER): Payer: Self-pay | Admitting: Emergency Medicine

## 2019-12-27 DIAGNOSIS — C482 Malignant neoplasm of peritoneum, unspecified: Secondary | ICD-10-CM | POA: Diagnosis not present

## 2019-12-27 DIAGNOSIS — E039 Hypothyroidism, unspecified: Secondary | ICD-10-CM | POA: Diagnosis not present

## 2019-12-27 DIAGNOSIS — R7989 Other specified abnormal findings of blood chemistry: Secondary | ICD-10-CM | POA: Diagnosis present

## 2019-12-27 DIAGNOSIS — Z79890 Hormone replacement therapy (postmenopausal): Secondary | ICD-10-CM

## 2019-12-27 DIAGNOSIS — C799 Secondary malignant neoplasm of unspecified site: Secondary | ICD-10-CM

## 2019-12-27 DIAGNOSIS — E876 Hypokalemia: Secondary | ICD-10-CM | POA: Diagnosis not present

## 2019-12-27 DIAGNOSIS — Z515 Encounter for palliative care: Secondary | ICD-10-CM | POA: Diagnosis not present

## 2019-12-27 DIAGNOSIS — Z881 Allergy status to other antibiotic agents status: Secondary | ICD-10-CM | POA: Diagnosis not present

## 2019-12-27 DIAGNOSIS — Z888 Allergy status to other drugs, medicaments and biological substances status: Secondary | ICD-10-CM | POA: Diagnosis not present

## 2019-12-27 DIAGNOSIS — R180 Malignant ascites: Secondary | ICD-10-CM | POA: Diagnosis present

## 2019-12-27 DIAGNOSIS — C786 Secondary malignant neoplasm of retroperitoneum and peritoneum: Secondary | ICD-10-CM

## 2019-12-27 DIAGNOSIS — K659 Peritonitis, unspecified: Secondary | ICD-10-CM

## 2019-12-27 DIAGNOSIS — Z882 Allergy status to sulfonamides status: Secondary | ICD-10-CM | POA: Diagnosis not present

## 2019-12-27 DIAGNOSIS — Z8541 Personal history of malignant neoplasm of cervix uteri: Secondary | ICD-10-CM | POA: Diagnosis not present

## 2019-12-27 DIAGNOSIS — Z8249 Family history of ischemic heart disease and other diseases of the circulatory system: Secondary | ICD-10-CM

## 2019-12-27 DIAGNOSIS — K219 Gastro-esophageal reflux disease without esophagitis: Secondary | ICD-10-CM | POA: Diagnosis present

## 2019-12-27 DIAGNOSIS — R531 Weakness: Secondary | ICD-10-CM | POA: Diagnosis not present

## 2019-12-27 DIAGNOSIS — Z66 Do not resuscitate: Secondary | ICD-10-CM | POA: Diagnosis not present

## 2019-12-27 DIAGNOSIS — B9689 Other specified bacterial agents as the cause of diseases classified elsewhere: Secondary | ICD-10-CM | POA: Diagnosis present

## 2019-12-27 DIAGNOSIS — D649 Anemia, unspecified: Secondary | ICD-10-CM | POA: Diagnosis present

## 2019-12-27 DIAGNOSIS — K650 Generalized (acute) peritonitis: Secondary | ICD-10-CM | POA: Insufficient documentation

## 2019-12-27 DIAGNOSIS — Z79899 Other long term (current) drug therapy: Secondary | ICD-10-CM | POA: Diagnosis not present

## 2019-12-27 DIAGNOSIS — Z20822 Contact with and (suspected) exposure to covid-19: Secondary | ICD-10-CM | POA: Diagnosis not present

## 2019-12-27 DIAGNOSIS — K658 Other peritonitis: Secondary | ICD-10-CM | POA: Diagnosis not present

## 2019-12-27 LAB — CBC WITH DIFFERENTIAL/PLATELET
Abs Immature Granulocytes: 0.93 10*3/uL — ABNORMAL HIGH (ref 0.00–0.07)
Basophils Absolute: 0.1 10*3/uL (ref 0.0–0.1)
Basophils Relative: 0 %
Eosinophils Absolute: 0 10*3/uL (ref 0.0–0.5)
Eosinophils Relative: 0 %
HCT: 30.6 % — ABNORMAL LOW (ref 36.0–46.0)
Hemoglobin: 9.9 g/dL — ABNORMAL LOW (ref 12.0–15.0)
Immature Granulocytes: 5 %
Lymphocytes Relative: 3 %
Lymphs Abs: 0.5 10*3/uL — ABNORMAL LOW (ref 0.7–4.0)
MCH: 28.1 pg (ref 26.0–34.0)
MCHC: 32.4 g/dL (ref 30.0–36.0)
MCV: 86.9 fL (ref 80.0–100.0)
Monocytes Absolute: 1.9 10*3/uL — ABNORMAL HIGH (ref 0.1–1.0)
Monocytes Relative: 9 %
Neutro Abs: 17 10*3/uL — ABNORMAL HIGH (ref 1.7–7.7)
Neutrophils Relative %: 83 %
Platelets: 1628 10*3/uL (ref 150–400)
RBC: 3.52 MIL/uL — ABNORMAL LOW (ref 3.87–5.11)
RDW: 15.7 % — ABNORMAL HIGH (ref 11.5–15.5)
WBC: 20.5 10*3/uL — ABNORMAL HIGH (ref 4.0–10.5)
nRBC: 0.1 % (ref 0.0–0.2)

## 2019-12-27 LAB — COMPREHENSIVE METABOLIC PANEL
ALT: 12 U/L (ref 0–44)
AST: 26 U/L (ref 15–41)
Albumin: 1.9 g/dL — ABNORMAL LOW (ref 3.5–5.0)
Alkaline Phosphatase: 141 U/L — ABNORMAL HIGH (ref 38–126)
Anion gap: 12 (ref 5–15)
BUN: 10 mg/dL (ref 8–23)
CO2: 21 mmol/L — ABNORMAL LOW (ref 22–32)
Calcium: 7.7 mg/dL — ABNORMAL LOW (ref 8.9–10.3)
Chloride: 103 mmol/L (ref 98–111)
Creatinine, Ser: 0.75 mg/dL (ref 0.44–1.00)
GFR, Estimated: >60 mL/min (ref >60)
Glucose, Bld: 135 mg/dL — ABNORMAL HIGH (ref 70–99)
Potassium: 3.2 mmol/L — ABNORMAL LOW (ref 3.5–5.1)
Sodium: 136 mmol/L (ref 135–145)
Total Bilirubin: 0.4 mg/dL (ref 0.3–1.2)
Total Protein: 5.3 g/dL — ABNORMAL LOW (ref 6.5–8.1)

## 2019-12-27 LAB — RESP PANEL BY RT-PCR (FLU A&B, COVID) ARPGX2
Influenza A by PCR: NEGATIVE
Influenza B by PCR: NEGATIVE
SARS Coronavirus 2 by RT PCR: NEGATIVE

## 2019-12-27 LAB — URINALYSIS, ROUTINE W REFLEX MICROSCOPIC
Bilirubin Urine: NEGATIVE
Glucose, UA: NEGATIVE mg/dL
Ketones, ur: NEGATIVE mg/dL
Nitrite: NEGATIVE
Protein, ur: NEGATIVE mg/dL
Specific Gravity, Urine: 1.02 (ref 1.005–1.030)
pH: 5 (ref 5.0–8.0)

## 2019-12-27 LAB — URINALYSIS, MICROSCOPIC (REFLEX)

## 2019-12-27 LAB — LIPASE, BLOOD: Lipase: 16 U/L (ref 11–51)

## 2019-12-27 LAB — LACTIC ACID, PLASMA
Lactic Acid, Venous: 1.1 mmol/L (ref 0.5–1.9)
Lactic Acid, Venous: 0.9 mmol/L (ref 0.5–1.9)

## 2019-12-27 MED ORDER — POLYVINYL ALCOHOL 1.4 % OP SOLN
1.0000 [drp] | Freq: Four times a day (QID) | OPHTHALMIC | Status: DC | PRN
  Filled 2019-12-27: qty 15

## 2019-12-27 MED ORDER — METRONIDAZOLE 500 MG PO TABS
500.0000 mg | ORAL_TABLET | Freq: Three times a day (TID) | ORAL | Status: DC
  Filled 2019-12-27: qty 1

## 2019-12-27 MED ORDER — GLYCOPYRROLATE 0.2 MG/ML IJ SOLN
0.2000 mg | INTRAMUSCULAR | Status: DC | PRN
  Filled 2019-12-27: qty 1

## 2019-12-27 MED ORDER — LORAZEPAM 2 MG/ML IJ SOLN: 1.0000 mg | INTRAMUSCULAR | Status: DC | PRN

## 2019-12-27 MED ORDER — LORAZEPAM 1 MG PO TABS: 1.0000 mg | ORAL_TABLET | ORAL | Status: DC | PRN

## 2019-12-27 MED ORDER — ONDANSETRON 4 MG PO TBDP: 4.0000 mg | ORAL_TABLET | Freq: Four times a day (QID) | ORAL | Status: DC | PRN

## 2019-12-27 MED ORDER — OXYCODONE HCL 5 MG PO TABS
5.0000 mg | ORAL_TABLET | ORAL | Status: DC | PRN
  Administered 2019-12-27 – 2019-12-28 (×4): 5 mg via ORAL
  Filled 2019-12-27 (×4): qty 1

## 2019-12-27 MED ORDER — LORAZEPAM 2 MG/ML PO CONC: 1.0000 mg | ORAL | Status: DC | PRN

## 2019-12-27 MED ORDER — SODIUM CHLORIDE 0.9 % IV BOLUS
1000.0000 mL | Freq: Once | INTRAVENOUS | Status: AC
  Administered 2019-12-27: 05:00:00 1000 mL via INTRAVENOUS

## 2019-12-27 MED ORDER — SODIUM CHLORIDE 0.9 % IV SOLN: Freq: Once | INTRAVENOUS | Status: AC

## 2019-12-27 MED ORDER — GLYCOPYRROLATE 1 MG PO TABS
1.0000 mg | ORAL_TABLET | ORAL | Status: DC | PRN
  Filled 2019-12-27: qty 1

## 2019-12-27 MED ORDER — ONDANSETRON HCL 4 MG/2ML IJ SOLN
4.0000 mg | Freq: Four times a day (QID) | INTRAMUSCULAR | Status: DC | PRN
  Administered 2019-12-27 – 2019-12-28 (×4): 4 mg via INTRAVENOUS
  Filled 2019-12-27 (×4): qty 2

## 2019-12-27 MED ORDER — MORPHINE SULFATE (PF) 2 MG/ML IV SOLN
1.0000 mg | INTRAVENOUS | Status: DC | PRN
  Administered 2019-12-27: 1 mg via INTRAVENOUS
  Filled 2019-12-27: qty 1

## 2019-12-27 MED ORDER — SODIUM CHLORIDE 0.9 % IV SOLN: INTRAVENOUS | Status: DC

## 2019-12-27 MED ORDER — PROCHLORPERAZINE EDISYLATE 10 MG/2ML IJ SOLN
10.0000 mg | Freq: Once | INTRAMUSCULAR | Status: AC
  Administered 2019-12-27: 10:00:00 10 mg via INTRAVENOUS
  Filled 2019-12-27: qty 2

## 2019-12-27 MED ORDER — CIPROFLOXACIN IN D5W 400 MG/200ML IV SOLN
400.0000 mg | Freq: Two times a day (BID) | INTRAVENOUS | Status: DC
  Administered 2019-12-27: 09:00:00 400 mg via INTRAVENOUS
  Filled 2019-12-27: qty 200

## 2019-12-27 MED ORDER — BIOTENE DRY MOUTH MT LIQD: 15.0000 mL | OROMUCOSAL | Status: DC | PRN

## 2019-12-27 MED ORDER — ACETAMINOPHEN 650 MG RE SUPP: 650.0000 mg | Freq: Four times a day (QID) | RECTAL | Status: DC | PRN

## 2019-12-27 MED ORDER — DIPHENHYDRAMINE HCL 50 MG/ML IJ SOLN: 12.5000 mg | INTRAMUSCULAR | Status: DC | PRN

## 2019-12-27 MED ORDER — IOHEXOL 300 MG/ML  SOLN
100.0000 mL | Freq: Once | INTRAMUSCULAR | Status: AC | PRN
  Administered 2019-12-27: 07:00:00 100 mL via INTRAVENOUS

## 2019-12-27 MED ORDER — ACETAMINOPHEN 325 MG PO TABS: 650.0000 mg | ORAL_TABLET | Freq: Four times a day (QID) | ORAL | Status: DC | PRN

## 2019-12-27 MED ORDER — MORPHINE SULFATE (PF) 4 MG/ML IV SOLN
4.0000 mg | Freq: Once | INTRAVENOUS | Status: AC
Start: 2019-12-27 — End: 2019-12-27
  Administered 2019-12-27: 05:00:00 4 mg via INTRAVENOUS
  Filled 2019-12-27: qty 1

## 2019-12-27 MED ORDER — CIPROFLOXACIN IN D5W 400 MG/200ML IV SOLN
400.0000 mg | Freq: Two times a day (BID) | INTRAVENOUS | Status: DC
  Administered 2019-12-27 – 2019-12-28 (×2): 400 mg via INTRAVENOUS
  Filled 2019-12-27 (×2): qty 200

## 2019-12-27 NOTE — ED Notes (Signed)
Abnormal platelet level called in. EDP made aware

## 2019-12-27 NOTE — H&P (Signed)
History and Physical    Laura Woodward [id number] DOB: Jun 07, 1955 DOA: 12/27/2019  PCP: Patient, No Pcp Per  Patient coming from: [facility]  Chief Complaint: generalized weakness  HPI: Laura Woodward is a 64 y.o. female with medical history significant of metastatic cervical cancer. Presenting with generalized weakness. Recently discharged from hospital. Admission was for acute bacterial peritonitis with malignant ascites. She was sent home with home hospice d/t widely metastatic SCC of the cervix. It was recommended that she go to a residential hospice, but she went home with some intermittent help. She got home and had only received a drain. She says everything wasn't in place for hospice. She is unable to take care of herself. She felt terrible and was unable to secure what she felt she needed, so she came back to the ED.   ED Course: She confirmed that she wanted to remain comfort care measures, but would like to complete her course of abx. She was started on cipro and flagyl. TRH was called for admission.   Review of Systems:  Denies CP, palpitations, dyspnea. Reports N, weakness, dehydration. Review of systems is otherwise negative for all not mentioned in HPI.   PMHx Past Medical History:  Diagnosis Date  . Cancer [facility])     PSHx Past Surgical History:  Procedure Laterality Date  . ABDOMINAL SURGERY    . TONSILLECTOMY      SocHx  reports that she has never smoked. She has never used smokeless tobacco. She reports previous alcohol use. She reports that she does not use drugs.  Allergies  Allergen Reactions  . Paclitaxel Itching    Flushing, redness   . Sulfa Antibiotics     Leg swelling  . Ceftriaxone Nausea And Vomiting    FamHx Family History  Problem Relation Age of Onset  . Heart disease Mother   . Heart disease Father     Prior to Admission medications   Medication Sig Start Date End Date Taking? Authorizing Provider  tiZANidine (ZANAFLEX) 2 MG tablet Take  by mouth. 11/30/19  Yes [provider]  acetaminophen (TYLENOL) 325 MG tablet Take 2 tablets (650 mg total) by mouth every 6 (six) hours as needed for mild pain (or Fever >/= 101). 12/24/19   Hosie Poisson, MD  ciprofloxacin (CIPRO) 500 MG tablet Take 1 tablet (500 mg total) by mouth 2 (two) times daily for 7 days. 12/24/19 12/31/19  Hosie Poisson, MD  levothyroxine (SYNTHROID) 125 MCG tablet Take 125 mcg by mouth daily. 12/11/19   [provider]  oxyCODONE-acetaminophen (PERCOCET/ROXICET) 5-325 MG tablet Take 1 tablet by mouth every 6 (six) hours as needed for up to 3 days for moderate pain or severe pain. 12/24/19 12/27/19  Hosie Poisson, MD  pantoprazole (PROTONIX) 40 MG tablet Take by mouth. 11/27/19 11/26/20  [provider]  potassium chloride 20 MEQ TBCR Take 20 mEq by mouth daily for 10 days. 03/14/18 03/24/18  Pokhrel, Corrie Mckusick, MD  traZODone (DESYREL) 50 MG tablet Take 50 mg by mouth at bedtime as needed for sleep.  02/20/18 02/20/19  [provider]    Physical Exam: Vitals:   12/27/19 0930 12/27/19 0945 12/27/19 1000 12/27/19 1138  BP: 133/86  (!) 142/89 137/83  Pulse: (!) 121 (!) 124 (!) 122 (!) 117  Resp: 19 (!) 21 (!) 23 20  Temp: 99 F (37.2 C)   98.2 F (36.8 C)  TempSrc: Oral   Oral  SpO2: 94% 95% 96% 97%  Weight:  Height:        General: 64 y.o. ill appearing female resting in bed Eyes: PERRL, normal sclera ENMT: Nares patent w/o discharge, orophaynx clear, dentition normal, ears w/o discharge/lesions/ulcers Neck: Supple, trachea midline Cardiovascular: tachy, +S1, S2, no m/g/r, equal pulses throughout Respiratory: CTABL, no w/r/r, normal WOB GI: BS hypoactive, distended, mild TTP LLQ, no masses noted, no organomegaly noted MSK: No e/c/c Skin: No rashes, bruises, ulcerations noted Neuro: A&O x 3, no focal deficits Psyc: Appropriate interaction but flat affect, calm/cooperative  Labs on Admission: I have personally reviewed  following labs and imaging studies  CBC: Recent Labs  Lab 12/21/19 0510 12/24/19 0925 12/27/19 0410  WBC 9.5 14.1* 20.5*  NEUTROABS  --   --  17.0*  HGB 8.7* 9.2* 9.9*  HCT 28.9* 29.9* 30.6*  MCV 91.2 89.5 86.9  PLT 839* 1,506* [id number]*   Basic Metabolic Panel: Recent Labs  Lab 12/21/19 0510 12/24/19 0925 12/27/19 0410  NA 138 139 136  K 3.7 3.0* 3.2*  CL 107 105 103  CO2 21* 23 21*  GLUCOSE 90 113* 135*  BUN 10 6* 10  CREATININE 0.70 0.57 0.75  CALCIUM 7.3* 7.3* 7.7*  MG 1.7  --   --    GFR: Estimated Creatinine Clearance: 69.6 mL/min (by C-G formula based on SCr of 0.75 mg/dL). Liver Function Tests: Recent Labs  Lab 12/24/19 0925 12/27/19 0410  AST 20 26  ALT 12 12  ALKPHOS 180* 141*  BILITOT 0.4 0.4  PROT 5.2* 5.3*  ALBUMIN 1.9* 1.9*   Recent Labs  Lab 12/27/19 0410  LIPASE 16   No results for input(s): AMMONIA in the last 168 hours. Coagulation Profile: No results for input(s): INR, PROTIME in the last 168 hours. Cardiac Enzymes: No results for input(s): CKTOTAL, CKMB, CKMBINDEX, TROPONINI in the last 168 hours. BNP (last 3 results) No results for input(s): PROBNP in the last 8760 hours. HbA1C: No results for input(s): HGBA1C in the last 72 hours. CBG: Recent Labs  Lab 12/21/19 0930  GLUCAP 91   Lipid Profile: No results for input(s): CHOL, HDL, LDLCALC, TRIG, CHOLHDL, LDLDIRECT in the last 72 hours. Thyroid Function Tests: No results for input(s): TSH, T4TOTAL, FREET4, T3FREE, THYROIDAB in the last 72 hours. Anemia Panel: No results for input(s): VITAMINB12, FOLATE, FERRITIN, TIBC, IRON, RETICCTPCT in the last 72 hours. Urine analysis:    Component Value Date/Time   COLORURINE YELLOW 12/27/2019 0405   APPEARANCEUR HAZY (A) 12/27/2019 0405   LABSPEC 1.020 12/27/2019 0405   PHURINE 5.0 12/27/2019 0405   GLUCOSEU NEGATIVE 12/27/2019 0405   HGBUR MODERATE (A) 12/27/2019 0405   BILIRUBINUR NEGATIVE 12/27/2019 0405   KETONESUR NEGATIVE  12/27/2019 0405   PROTEINUR NEGATIVE 12/27/2019 0405   NITRITE NEGATIVE 12/27/2019 0405   LEUKOCYTESUR SMALL (A) 12/27/2019 0405    Radiological Exams on Admission: CT ABDOMEN PELVIS W CONTRAST  Result Date: 12/27/2019 CLINICAL DATA:  Abdominal distention. EXAM: CT ABDOMEN AND PELVIS WITH CONTRAST TECHNIQUE: Multidetector CT imaging of the abdomen and pelvis was performed using the standard protocol following bolus administration of intravenous contrast. CONTRAST:  138mL OMNIPAQUE IOHEXOL 300 MG/ML  SOLN COMPARISON:  12/18/2019 FINDINGS: Lower chest: Interval increase in right pleural effusion now moderate to large. Bibasilar collapse/consolidative opacity is progressive in there is persistent tiny left pleural effusion. Hepatobiliary: No suspicious focal abnormality within the liver parenchyma. Probable gallbladder sludge. No intrahepatic or extrahepatic biliary dilation. Pancreas: No focal mass lesion. No dilatation of the main duct. No intraparenchymal cyst.  No peripancreatic edema. Spleen: No splenomegaly. No focal mass lesion. Adrenals/Urinary Tract: No adrenal nodule or mass. Left double-J internal ureteral stent is again noted. Mild right hydroureteronephrosis is stable to minimally progressed since prior. Irregular bladder wall thickening evident with areas of nodularity again noted. Stomach/Bowel: Stomach is unremarkable. No gastric wall thickening. No evidence of outlet obstruction. Duodenum is normally positioned as is the ligament of Treitz. No small bowel wall thickening. No small bowel dilatation. No gross colonic mass. No colonic wall thickening. Vascular/Lymphatic: No abdominal aortic aneurysm. No abdominal aortic atherosclerotic calcification. There is no gastrohepatic or hepatoduodenal ligament lymphadenopathy. No retroperitoneal or mesenteric lymphadenopathy. 1.5 cm short axis left external iliac node on 75/2 is similar to prior. Borderline lymphadenopathy along the right pelvic sidewall  is not substantially changed. Reproductive: As on prior study, there appears to be marked fluid-filled distention of the endometrial cavity. Small cystic lesion noted right adnexal space. Other: Interval decrease in volume of intraperitoneal fluid with intraperitoneal drain identified lower anterior pelvis. Numerous areas of irregular peritoneal thickening are associated with progression of irregular and nodular soft tissue density in the omentum. Mesenteric congestion noted in the central abdomen. Musculoskeletal: Mixed lytic and sclerotic lesion in the left ischial tuberosity is similar to prior. Sclerotic lesion noted left femoral head, as before. Heterogeneity of sacral mineralization is similar. IMPRESSION: 1. Interval increase in right pleural effusion now moderate to large. Bibasilar collapse/consolidative opacity is progressive in there is persistent tiny left pleural effusion. 2. Interval decrease in volume of intraperitoneal fluid with intraperitoneal drain identified lower anterior pelvis. 3. Numerous areas of irregular peritoneal thickening associated with progression of irregular and nodular soft tissue density in the omentum. Imaging features are consistent with reported history of peritoneal carcinomatosis. 4. Persistent marked fluid-filled distention of the endometrial cavity. 5. Similar appearance of irregular bladder wall thickening with areas of nodularity. 6. Similar appearance of mild lymphadenopathy in the pelvis. 7. Similar appearance of mixed lytic and sclerotic lesion in the left ischial tuberosity and left femoral head. 8. Left double-J internal ureteral stent with stable mild right hydroureteronephrosis. 9. Aortic Atherosclerosis (ICD10-I70.0). Electronically Signed   By: Misty Stanley M.D.   On: 12/27/2019 07:45   DG Chest Portable 1 View  Result Date: 12/27/2019 CLINICAL DATA:  Cough and weakness EXAM: PORTABLE CHEST 1 VIEW COMPARISON:  Eight days ago FINDINGS: Low volume chest,  greater on the right where there is diaphragm elevation and hazy density. No edema or pneumothorax. Normal heart size. IMPRESSION: Low volume chest with hazy opacity at the right base which is likely atelectasis and pleural fluid based on a 12/18/2019 abdominal CT. Electronically Signed   By: Monte Fantasia M.D.   On: 12/27/2019 04:21   Assessment/Plan Metastatic cervical SCC w/ malignant ascites Peritoneal carcinomatosis Bacterial Peritonitis     - admitted to obs, med-surg     - patient is hospice/comfort care measures     - she would like to continue her abx regimen; we will do that     - will give her fluids, anti-emetics, pain control as well     - will need residential hospice placement     - she remains DNR/CC otherwise  Comfort Care Measure/Hospice Patient Normocytic anemia Hypokalemia Elevated LFTs Hypothyroidism GERD     - patient is comfort care measures, no further treatment or workup of the conditions immediately above   DVT prophylaxis: Comfort care measures.  Code Status: DNR/Comfort care measures  Family Communication: None at bedside.  Consults  called: None   Status is: Observation  The patient remains OBS appropriate and will d/c before 2 midnights.  Dispo: The patient is from: Home              Anticipated d/c is to: Residential Hospice              Anticipated d/c date is: 1 day              Patient currently is not medically stable to d/c.  Jonnie Finner DO [facility]  If 7PM-7AM, please contact night-coverage [email or web address]  12/27/2019, 12:30 PM

## 2019-12-27 NOTE — ED Notes (Signed)
[facility] is here and takes her from our facility without incident.

## 2019-12-27 NOTE — ED Triage Notes (Addendum)
Reports worsening weakness today.  Seen at [facility] for the same on 12/18/19.  Reports she is a cancer patient and she just got signed up for Hospice.  Reports she has had diarrhea today as well.  No more stools since taking imodium.  Recently diagnosed with pneumonia.  Still taking cipro.

## 2019-12-27 NOTE — ED Provider Notes (Signed)
[facility] EMERGENCY DEPARTMENT Provider Note   CSN: [id number] Arrival date & time: 12/27/19  0146     History Chief Complaint  Patient presents with   Weakness    Laura Woodward is a 64 y.o. female.  HPI     This is 64 year old female with a history of metastatic cervical cancer with malignant ascites and peritoneal carcinomatosis who presents with generalized weakness.  Patient states that she just does not feel good.  She was seen and evaluated and admitted to [facility] for the same.  She was just discharged on 12/23.  She cannot tell me why.  She states she has had chills.  She reports ongoing diarrhea.  No bloody stools.  She took Imodium with no relief.  She is not documented any fevers at home.  She has had no sick contacts or Covid exposures.  She has not yet been vaccinated.  Chart review reveals discharge from [facility] 12/23.  She was discharged on hospice.  She was admitted for malignant ascites and bacterial peritonitis.  She had borderline blood pressures at discharge but was adamant about going home.  She was transitioned to oral ciprofloxacin.  Blood cultures were negative at time of discharge.  Past Medical History:  Diagnosis Date   Cancer [facility])     Patient Active Problem List   Diagnosis Date Noted   Peritoneal carcinomatosis ([place]) 12/19/2019   Malignant ascites 12/19/2019   Acute bacterial peritonitis ([place]) 12/19/2019   Pelvic abscess in female 12/19/2019   Cellulitis of abdominal wall 12/19/2019   Goals of care, counseling/discussion 12/19/2019   Hypothyroidism (acquired) 12/19/2019   GERD without esophagitis 12/19/2019   Carcinomatosis ([place]) 11/22/2019   Neutropenic fever ([place]) 03/10/2018   SIRS (systemic inflammatory response syndrome) ([place]) 03/10/2018   Squamous cell carcinoma of cervix ([place]) 03/10/2018    Past Surgical History:  Procedure Laterality Date   ABDOMINAL SURGERY     TONSILLECTOMY        OB History   No obstetric history on file.     Family History  Problem Relation Age of Onset   Heart disease Mother    Heart disease Father     Social History   Tobacco Use   Smoking status: Never Smoker   Smokeless tobacco: Never Used  Substance Use Topics   Alcohol use: Not Currently   Drug use: Never    Home Medications Prior to Admission medications   Medication Sig Start Date End Date Taking? Authorizing Provider  tiZANidine (ZANAFLEX) 2 MG tablet Take by mouth. 11/30/19  Yes [provider]  acetaminophen (TYLENOL) 325 MG tablet Take 2 tablets (650 mg total) by mouth every 6 (six) hours as needed for mild pain (or Fever >/= 101). 12/24/19   Hosie Poisson, MD  ciprofloxacin (CIPRO) 500 MG tablet Take 1 tablet (500 mg total) by mouth 2 (two) times daily for 7 days. 12/24/19 12/31/19  Hosie Poisson, MD  levothyroxine (SYNTHROID) 125 MCG tablet Take 125 mcg by mouth daily. 12/11/19   [provider]  oxyCODONE-acetaminophen (PERCOCET/ROXICET) 5-325 MG tablet Take 1 tablet by mouth every 6 (six) hours as needed for up to 3 days for moderate pain or severe pain. 12/24/19 12/27/19  Hosie Poisson, MD  pantoprazole (PROTONIX) 40 MG tablet Take by mouth. 11/27/19 11/26/20  [provider]  potassium chloride 20 MEQ TBCR Take 20 mEq by mouth daily for 10 days. 03/14/18 03/24/18  Pokhrel, Corrie Mckusick, MD  traZODone (DESYREL) 50 MG tablet  Take 50 mg by mouth at bedtime as needed for sleep.  02/20/18 02/20/19  [provider]    Allergies    Paclitaxel, Sulfa antibiotics, and Ceftriaxone  Review of Systems   Review of Systems  Constitutional: Positive for chills. Negative for fever.  Respiratory: Negative for shortness of breath.   Cardiovascular: Negative for chest pain.  Gastrointestinal: Positive for diarrhea and nausea. Negative for abdominal pain and vomiting.  Genitourinary: Negative for dysuria.  All other systems reviewed and are  negative.   Physical Exam Updated Vital Signs BP 132/75    Pulse (!) 116    Temp 99.7 F (37.6 C) (Rectal)    Resp (!) 22    Ht 1.689 m (5' 6.5")    Wt 69 kg    SpO2 94%    BMI 24.18 kg/m   Physical Exam Vitals and nursing note reviewed.  Constitutional:      Appearance: She is well-developed and well-nourished.     Comments: Chronically ill-appearing  HENT:     Head: Normocephalic and atraumatic.     Mouth/Throat:     Mouth: Mucous membranes are dry.  Eyes:     Pupils: Pupils are equal, round, and reactive to light.  Cardiovascular:     Rate and Rhythm: Regular rhythm. Tachycardia present.     Heart sounds: Normal heart sounds.  Pulmonary:     Effort: Pulmonary effort is normal. No respiratory distress.     Breath sounds: No wheezing.  Abdominal:     General: Bowel sounds are normal. There is distension.     Palpations: Abdomen is soft.     Comments: Diffuse tenderness, no rebound or guarding, PD catheter right upper quadrant  Musculoskeletal:     Cervical back: Neck supple.     Right lower leg: No edema.     Left lower leg: No edema.  Skin:    General: Skin is warm and dry.  Neurological:     Mental Status: She is alert and oriented to person, place, and time.  Psychiatric:        Mood and Affect: Mood and affect normal.     Comments: Flat affect     ED Results / Procedures / Treatments   Labs (all labs ordered are listed, but only abnormal results are displayed) Labs Reviewed  CBC WITH DIFFERENTIAL/PLATELET - Abnormal; Notable for the following components:      Result Value   WBC 20.5 (*)    RBC 3.52 (*)    Hemoglobin 9.9 (*)    HCT 30.6 (*)    RDW 15.7 (*)    Platelets 1,628 (*)    Neutro Abs 17.0 (*)    Lymphs Abs 0.5 (*)    Monocytes Absolute 1.9 (*)    Abs Immature Granulocytes 0.93 (*)    All other components within normal limits  URINALYSIS, ROUTINE W REFLEX MICROSCOPIC - Abnormal; Notable for the following components:   APPearance HAZY (*)     Hgb urine dipstick MODERATE (*)    Leukocytes,Ua SMALL (*)    All other components within normal limits  COMPREHENSIVE METABOLIC PANEL - Abnormal; Notable for the following components:   Potassium 3.2 (*)    CO2 21 (*)    Glucose, Bld 135 (*)    Calcium 7.7 (*)    Total Protein 5.3 (*)    Albumin 1.9 (*)    Alkaline Phosphatase 141 (*)    All other components within normal limits  URINALYSIS, MICROSCOPIC (REFLEX) -  Abnormal; Notable for the following components:   Bacteria, UA MANY (*)    All other components within normal limits  RESP PANEL BY RT-PCR (FLU A&B, COVID) [id number]  URINE CULTURE  LACTIC ACID, PLASMA  LACTIC ACID, PLASMA  LIPASE, BLOOD  PATHOLOGIST SMEAR REVIEW    EKG EKG Interpretation  Date/Time:  Sunday December 27 2019 04:11:21 EST Ventricular Rate:  123 PR Interval:    QRS Duration: 80 QT Interval:  329 QTC Calculation: 471 R Axis:   75 Text Interpretation: Sinus tachycardia Borderline repol abnormality, diffuse leads Baseline wander in lead(s) V5 Confirmed by Thayer Jew [phone]) on 12/27/2019 5:38:35 AM   Radiology DG Chest Portable 1 View  Result Date: 12/27/2019 CLINICAL DATA:  Cough and weakness EXAM: PORTABLE CHEST 1 VIEW COMPARISON:  Eight days ago FINDINGS: Low volume chest, greater on the right where there is diaphragm elevation and hazy density. No edema or pneumothorax. Normal heart size. IMPRESSION: Low volume chest with hazy opacity at the right base which is likely atelectasis and pleural fluid based on a 12/18/2019 abdominal CT. Electronically Signed   By: Monte Fantasia M.D.   On: 12/27/2019 04:21    Procedures Procedures (including critical care time)  Medications Ordered in ED Medications  0.9 %  sodium chloride infusion (has no administration in time range)  sodium chloride 0.9 % bolus 1,000 mL ( Intravenous Stopped 12/27/19 0534)  morphine 4 MG/ML injection 4 mg (4 mg Intravenous Given 12/27/19 0517)    ED Course  I have  reviewed the triage vital signs and the nursing notes.  Pertinent labs & imaging results that were available during my care of the patient were reviewed by me and considered in my medical decision making (see chart for details).  Clinical Course as of 12/27/19 0640  Sun Dec 27, 2019  0638 Labs reviewed.  Worsening leukocytosis but normal lactate.  She also has increasing platelet count.  Likely acute phase reactant.  I had a long discussion with patient regarding goals of care.  She left prematurely from the hospital with plans to seek hospice care.  She is getting home hospice.  She states that "they cannot help her."  She was referring to the fact that she did not meet criteria for inpatient hospice.  Because of this, she is willing to have further work-up and would be amenable to inpatient therapy and antibiotics.  She has ongoing distention and with increasing white count, would reCT to evaluate for progression.  Patient was dosed IV Rocephin and placed on maintenance fluids. [CH]    Clinical Course User Index [CH] Alcie Runions, Barbette Hair, MD   MDM Rules/Calculators/A&P                           Patient presents with generalized weakness.  She has metastatic cancer.  Was discharged from the hospital on 12/23 prematurely per hospitalist discharge summary on oral Cipro.  She has a possible pelvic abscess and peritonitis.  She is dry appearing on exam.  Abdomen is distended and diffusely tender.  She is obviously deconditioned but likely has acute ongoing infection and peritonitis.  She denies fever but reports chills.  Will repeat lab work.  White count is now 20.5 which is increasing with a left shift.  Platelet counts increasing to 16,000 which is likely an acute phase reactant.  Potassium 3.2.  Creatinine stable.  See my discussion with the patient above.  Apparently she was anticipating qualifying  for respite hospice and hospice house.  However, she did not qualify.  I told her that I did not see any  need to reCT her unless she was willing to have IV antibiotics and ongoing management for likely acute worsening infection.  Patient states that this is what she would like.  I doubt this would change her course in the long-term.  However, will reCT.  She was given IV Rocephin and started on maintenance fluids.  Anticipate admission.   Final Clinical Impression(s) / ED Diagnoses Final diagnoses:  Generalized weakness  Peritonitis [facility])    Rx / DC Orders ED Discharge Orders    None       Arlanda Shiplett, Barbette Hair, MD 12/27/19 [id number]

## 2019-12-27 NOTE — ED Notes (Signed)
asI write this, she is in CT.

## 2019-12-27 NOTE — ED Provider Notes (Signed)
Patient initially seen by Dr. Dina Rich.  Please see her note.  Patient was being treated by hospice at home but symptoms were worsening.  Patient CT scan this morning does show worsening right pleural effusion.  Persistent peritoneal carcinomatosis.  Pt states she still wants hospice, comfort care.  They were not able to get all the resources for her over the holiday. Will consult for admission for for further treatment, pain management, supportive care.  CT ABDOMEN PELVIS W CONTRAST  Result Date: 12/27/2019 CLINICAL DATA:  Abdominal distention. EXAM: CT ABDOMEN AND PELVIS WITH CONTRAST TECHNIQUE: Multidetector CT imaging of the abdomen and pelvis was performed using the standard protocol following bolus administration of intravenous contrast. CONTRAST:  160mL OMNIPAQUE IOHEXOL 300 MG/ML  SOLN COMPARISON:  12/18/2019 FINDINGS: Lower chest: Interval increase in right pleural effusion now moderate to large. Bibasilar collapse/consolidative opacity is progressive in there is persistent tiny left pleural effusion. Hepatobiliary: No suspicious focal abnormality within the liver parenchyma. Probable gallbladder sludge. No intrahepatic or extrahepatic biliary dilation. Pancreas: No focal mass lesion. No dilatation of the main duct. No intraparenchymal cyst. No peripancreatic edema. Spleen: No splenomegaly. No focal mass lesion. Adrenals/Urinary Tract: No adrenal nodule or mass. Left double-J internal ureteral stent is again noted. Mild right hydroureteronephrosis is stable to minimally progressed since prior. Irregular bladder wall thickening evident with areas of nodularity again noted. Stomach/Bowel: Stomach is unremarkable. No gastric wall thickening. No evidence of outlet obstruction. Duodenum is normally positioned as is the ligament of Treitz. No small bowel wall thickening. No small bowel dilatation. No gross colonic mass. No colonic wall thickening. Vascular/Lymphatic: No abdominal aortic aneurysm. No  abdominal aortic atherosclerotic calcification. There is no gastrohepatic or hepatoduodenal ligament lymphadenopathy. No retroperitoneal or mesenteric lymphadenopathy. 1.5 cm short axis left external iliac node on 75/2 is similar to prior. Borderline lymphadenopathy along the right pelvic sidewall is not substantially changed. Reproductive: As on prior study, there appears to be marked fluid-filled distention of the endometrial cavity. Small cystic lesion noted right adnexal space. Other: Interval decrease in volume of intraperitoneal fluid with intraperitoneal drain identified lower anterior pelvis. Numerous areas of irregular peritoneal thickening are associated with progression of irregular and nodular soft tissue density in the omentum. Mesenteric congestion noted in the central abdomen. Musculoskeletal: Mixed lytic and sclerotic lesion in the left ischial tuberosity is similar to prior. Sclerotic lesion noted left femoral head, as before. Heterogeneity of sacral mineralization is similar. IMPRESSION: 1. Interval increase in right pleural effusion now moderate to large. Bibasilar collapse/consolidative opacity is progressive in there is persistent tiny left pleural effusion. 2. Interval decrease in volume of intraperitoneal fluid with intraperitoneal drain identified lower anterior pelvis. 3. Numerous areas of irregular peritoneal thickening associated with progression of irregular and nodular soft tissue density in the omentum. Imaging features are consistent with reported history of peritoneal carcinomatosis. 4. Persistent marked fluid-filled distention of the endometrial cavity. 5. Similar appearance of irregular bladder wall thickening with areas of nodularity. 6. Similar appearance of mild lymphadenopathy in the pelvis. 7. Similar appearance of mixed lytic and sclerotic lesion in the left ischial tuberosity and left femoral head. 8. Left double-J internal ureteral stent with stable mild right  hydroureteronephrosis. 9. Aortic Atherosclerosis (ICD10-I70.0). Electronically Signed   By: Misty Stanley M.D.   On: 12/27/2019 07:45   DG Chest Portable 1 View  Result Date: 12/27/2019 CLINICAL DATA:  Cough and weakness EXAM: PORTABLE CHEST 1 VIEW COMPARISON:  Eight days ago FINDINGS: Low volume chest, greater on the  right where there is diaphragm elevation and hazy density. No edema or pneumothorax. Normal heart size. IMPRESSION: Low volume chest with hazy opacity at the right base which is likely atelectasis and pleural fluid based on a 12/18/2019 abdominal CT. Electronically Signed   By: Monte Fantasia M.D.   On: 12/27/2019 04:21      Dorie Rank, MD 12/27/19 [phone]

## 2019-12-27 NOTE — ED Notes (Signed)
She tells me her only area of discomfort is thirst. Dr. Tomi Bamberger has [place] give her some ice water, for which she thanks [place].

## 2019-12-28 DIAGNOSIS — Z8541 Personal history of malignant neoplasm of cervix uteri: Secondary | ICD-10-CM | POA: Diagnosis not present

## 2019-12-28 DIAGNOSIS — R531 Weakness: Secondary | ICD-10-CM

## 2019-12-28 DIAGNOSIS — B9689 Other specified bacterial agents as the cause of diseases classified elsewhere: Secondary | ICD-10-CM | POA: Diagnosis present

## 2019-12-28 DIAGNOSIS — Z515 Encounter for palliative care: Secondary | ICD-10-CM

## 2019-12-28 DIAGNOSIS — C482 Malignant neoplasm of peritoneum, unspecified: Secondary | ICD-10-CM | POA: Diagnosis not present

## 2019-12-28 DIAGNOSIS — Z20822 Contact with and (suspected) exposure to covid-19: Secondary | ICD-10-CM | POA: Diagnosis present

## 2019-12-28 DIAGNOSIS — Z8249 Family history of ischemic heart disease and other diseases of the circulatory system: Secondary | ICD-10-CM | POA: Diagnosis not present

## 2019-12-28 DIAGNOSIS — Z66 Do not resuscitate: Secondary | ICD-10-CM | POA: Diagnosis present

## 2019-12-28 DIAGNOSIS — K219 Gastro-esophageal reflux disease without esophagitis: Secondary | ICD-10-CM | POA: Diagnosis present

## 2019-12-28 DIAGNOSIS — Z881 Allergy status to other antibiotic agents status: Secondary | ICD-10-CM | POA: Diagnosis not present

## 2019-12-28 DIAGNOSIS — Z888 Allergy status to other drugs, medicaments and biological substances status: Secondary | ICD-10-CM | POA: Diagnosis not present

## 2019-12-28 DIAGNOSIS — C786 Secondary malignant neoplasm of retroperitoneum and peritoneum: Secondary | ICD-10-CM | POA: Diagnosis not present

## 2019-12-28 DIAGNOSIS — Z79899 Other long term (current) drug therapy: Secondary | ICD-10-CM | POA: Diagnosis not present

## 2019-12-28 DIAGNOSIS — D649 Anemia, unspecified: Secondary | ICD-10-CM | POA: Diagnosis present

## 2019-12-28 DIAGNOSIS — Z7189 Other specified counseling: Secondary | ICD-10-CM | POA: Diagnosis not present

## 2019-12-28 DIAGNOSIS — Z79890 Hormone replacement therapy (postmenopausal): Secondary | ICD-10-CM | POA: Diagnosis not present

## 2019-12-28 DIAGNOSIS — R7989 Other specified abnormal findings of blood chemistry: Secondary | ICD-10-CM | POA: Diagnosis present

## 2019-12-28 DIAGNOSIS — K658 Other peritonitis: Secondary | ICD-10-CM | POA: Diagnosis present

## 2019-12-28 DIAGNOSIS — Z882 Allergy status to sulfonamides status: Secondary | ICD-10-CM | POA: Diagnosis not present

## 2019-12-28 DIAGNOSIS — E039 Hypothyroidism, unspecified: Secondary | ICD-10-CM | POA: Diagnosis present

## 2019-12-28 DIAGNOSIS — R180 Malignant ascites: Secondary | ICD-10-CM | POA: Diagnosis present

## 2019-12-28 DIAGNOSIS — E876 Hypokalemia: Secondary | ICD-10-CM | POA: Diagnosis present

## 2019-12-28 LAB — URINE CULTURE: Culture: <10000 — AB

## 2019-12-28 LAB — PATHOLOGIST SMEAR REVIEW

## 2019-12-28 MED ORDER — CIPROFLOXACIN HCL 500 MG PO TABS: 500.0000 mg | ORAL_TABLET | Freq: Two times a day (BID) | ORAL | Status: DC

## 2019-12-28 MED ORDER — CIPROFLOXACIN HCL 500 MG PO TABS: 500.0000 mg | ORAL_TABLET | Freq: Two times a day (BID) | ORAL | 0 refills | Status: AC

## 2019-12-28 NOTE — TOC Progression Note (Signed)
Transition of Care [facility]) - Progression Note    Patient Details  Name: Laura Woodward MRN: [id number] Date of Birth: 20-Sep-1955  Transition of [facility]) CM/SW Contact  Armanda Heritage, RN Phone Number: 12/28/2019, 12:53 PM  Clinical Narrative:    CM spoke with [facility] rep, patient is active with agency for home hospice. Per rep, patient is not residential hospice appropriate at this time and will need to return home with home hospice services.  CM spoke with patient and notified her of this and of planned discharge for today.  Patient reports she will reach out to see who can take her home today.  CM notified patient that soul she be unable to secure a ride, to notify bedside RN so that [facility] transport can be arranged.     Expected Discharge Plan: Home w Hospice Care Barriers to Discharge: No Barriers Identified  Expected Discharge Plan and Services Expected Discharge Plan: Home w Hospice Care   Discharge Planning Services: CM Consult     Expected Discharge Date: 12/28/19                                     Social Determinants of Health (SDOH) Interventions    Readmission Risk Interventions No flowsheet data found.

## 2019-12-28 NOTE — Consult Note (Signed)
Consultation Note Date: 12/28/2019   Patient Name: Laura Woodward  DOB: 01/09/1955  MRN: [id number]  Age / Sex: 64 y.o., female  PCP: Patient, No Pcp Per Referring Physician: Shelly Coss, MD  Reason for Consultation: Establishing goals of care  HPI/Patient Profile: 64 y.o. female    admitted on 12/27/2019    Clinical Assessment and Goals of Care: 64 year old lady life limiting illness of metastatic cervical cancer presenting with generalized weakness, recently discharged from the hospital for acute bacterial peritonitis with malignant ascites. Was sent home with hospice, patient states she came back because of abdominal pain and generalized weakness.  Palliative consult for ongoing goals of care discussions. Ms. Colombe is awake alert resting in bed. She states she feels a little bit better than when she came in. She remembers meeting inpatient palliative team from previous hospitalization a few days ago. We discussed about malignant ascites peritoneal carcinomatosis and current management for bacterial peritonitis. Discussed about hospice philosophy of care being for continuation of maximizing comfort and providing ease of suffering at end-of-life phase of terminal incurable illness. All of her concerns addressed to the best of my ability.  NEXT OF KIN Patient has a friend Elpidio Eric who helps make decisions.  SUMMARY OF RECOMMENDATIONS   Agree with DO NOT RESUSCITATE Goals of care: Continue current mode of care including antibiotics while the patient is in the hospital. Overall goals are to focus on comfort measures. Disposition: Ongoing discussions with the patient about differences between home with hospice versus residential hospice. Spent some time again with the patient reviewing about hospice philosophy of care and all that it entails. Thank you for the consult.  Code Status/Advance Care  Planning:  DNR    Symptom Management:   Continue current medications.  Palliative Prophylaxis:   Delirium Protocol   Psycho-social/Spiritual:   Desire for further Chaplaincy support:yes  Additional Recommendations: Education on Hospice  Prognosis:   Unable to determine  Discharge Planning: To Be Determined      Primary Diagnoses: Present on Admission: . Peritoneal carcinomatosis ([place])   I have reviewed the medical record, interviewed the patient and family, and examined the patient. The following aspects are pertinent.  Past Medical History:  Diagnosis Date  . Cancer [facility])    Social History   Socioeconomic History  . Marital status: Single    Spouse name: Not on file  . Number of children: Not on file  . Years of education: Not on file  . Highest education level: Not on file  Occupational History  . Not on file  Tobacco Use  . Smoking status: Never Smoker  . Smokeless tobacco: Never Used  Substance and Sexual Activity  . Alcohol use: Not Currently  . Drug use: Never  . Sexual activity: Not on file  Other Topics Concern  . Not on file  Social History Narrative  . Not on file   Social Determinants of Health   Financial Resource Strain: Not on file  Food Insecurity: Not on file  Transportation Needs: Not  on file  Physical Activity: Not on file  Stress: Not on file  Social Connections: Not on file   Family History  Problem Relation Age of Onset  . Heart disease Mother   . Heart disease Father    Scheduled Meds: . ciprofloxacin  500 mg Oral BID   Continuous Infusions: . sodium chloride 75 mL/hr at 12/28/19 0610   PRN Meds:.acetaminophen **OR** acetaminophen, antiseptic oral rinse, diphenhydrAMINE, glycopyrrolate **OR** glycopyrrolate **OR** glycopyrrolate, LORazepam **OR** LORazepam **OR** LORazepam, morphine injection, ondansetron **OR** ondansetron (ZOFRAN) IV, oxyCODONE, polyvinyl alcohol Medications Prior to Admission:  Prior to  Admission medications   Medication Sig Start Date End Date Taking? Authorizing Provider  acetaminophen (TYLENOL) 325 MG tablet Take 2 tablets (650 mg total) by mouth every 6 (six) hours as needed for mild pain (or Fever >/= 101). 12/24/19  Yes Kathlen Mody, MD  ciprofloxacin (CIPRO) 500 MG tablet Take 1 tablet (500 mg total) by mouth 2 (two) times daily for 7 days. Patient taking differently: Take 500 mg by mouth 2 (two) times daily. Start date : 12/24/19 12/24/19 12/31/19 Yes Kathlen Mody, MD  levothyroxine (SYNTHROID) 125 MCG tablet Take 125 mcg by mouth daily. 12/11/19  Yes [provider]  Methenamine-Sodium Salicylate (CYSTEX PO) Take 1 tablet by mouth daily.   Yes [provider]  pantoprazole (PROTONIX) 40 MG tablet Take 40 mg by mouth daily. 11/27/19 11/26/20 Yes [provider]  potassium chloride 20 MEQ TBCR Take 20 mEq by mouth daily for 10 days. 03/14/18 03/24/18 Yes Pokhrel, Laxman, MD  prochlorperazine (COMPAZINE) 10 MG tablet Take 10 mg by mouth daily as needed for nausea or vomiting.   Yes [provider]  tiZANidine (ZANAFLEX) 2 MG tablet Take 2 mg by mouth 2 (two) times daily as needed for muscle spasms. 11/30/19  Yes [provider]  traZODone (DESYREL) 50 MG tablet Take 50 mg by mouth at bedtime as needed for sleep.  02/20/18 12/27/19 Yes [provider]  ciprofloxacin (CIPRO) 500 MG tablet Take 1 tablet (500 mg total) by mouth 2 (two) times daily for 3 days. 12/28/19 12/31/19  Burnadette Pop, MD   Allergies  Allergen Reactions  . Nitrofurantoin Nausea Only and Rash    Other reaction(s): Vomiting  . Paclitaxel Itching    Flushing, redness   . Sulfa Antibiotics     Leg swelling  . Ceftriaxone Nausea And Vomiting   Review of Systems Complains of mild generalized pain Physical Exam Appears chronically ill Resting in bed Has some abdominal discomfort going on Regular work of breathing S1-S2 Awake alert  oriented Flat affect No edema  Vital Signs: BP 126/80 (BP Location: Right Arm)   Pulse (!) 108   Temp 98.3 F (36.8 C) (Oral)   Resp 20   Ht 5' 6.5" (1.689 m)   Wt 69 kg   SpO2 92%   BMI 24.18 kg/m  Pain Scale: 0-10   Pain Score: 3    SpO2: SpO2: 92 % O2 Device:SpO2: 92 % O2 Flow Rate: .   IO: Intake/output summary:   Intake/Output Summary (Last 24 hours) at 12/28/2019 1020 Last data filed at 12/28/2019 1019 Gross per 24 hour  Intake 1641.25 ml  Output 1150 ml  Net 491.25 ml    LBM: Last BM Date: 12/26/19 Baseline Weight: Weight: 69 kg Most recent weight: Weight: 69 kg     Palliative Assessment/Data:   PPS 40%  Time In:  9 Time Out: 10  Time Total: 60   Greater  than 50%  of this time was spent counseling and coordinating care related to the above assessment and plan.  Signed by: [name], MD   Please contact Palliative Medicine Team phone at [phone] for questions and concerns.  For individual provider: See Shea Evans

## 2019-12-28 NOTE — Discharge Summary (Addendum)
Physician Discharge Summary  Laura Woodward [id number] DOB: 01-09-1955 DOA: 12/27/2019  PCP: Patient, No Pcp Per  Admit date: 12/27/2019 Discharge date: 12/28/2019  Admitted From: Home Disposition:  Residential Hospice Discharge Condition:Stable CODE STATUS: Comfort Care Diet recommendation: Regular   Brief/Interim Summary:  HPI( as per Dr Marylyn Ishihara): Laura Woodward is a 64 y.o. female with medical history significant of metastatic cervical cancer. Presenting with generalized weakness. Recently discharged from hospital. Admission was for acute bacterial peritonitis with malignant ascites. She was sent home with home hospice d/t widely metastatic SCC of the cervix. It was recommended that she go to a residential hospice, but she went home with some intermittent help. She got home and had only received a drain. She says everything wasn't in place for hospice. She is unable to take care of herself. She felt terrible and was unable to secure what she felt she needed, so she came back to the   Hospital Course: Patient expressed her wish to be started on comfort care and be discharged to residential hospice.  She wanted to continue ciprofloxacin that she was discharged on, to complete the course of 7 days.  Patient started on full comfort care here.  We have requested TOC to arrange discharge to residential hospice.  She can be discharged to residential hospice as soon as possible  Following problems were addressed during her hospitalization:  Metastatic cervical SCC w/ malignant ascites Peritoneal carcinomatosis Bacterial Peritonitis     - patient started  hospice/comfort care measures     - she would like to continue her abx regimen;plan for total of 7 days course stating from 12/24/19     - On anti-emetics, pain control as well     -  needs residential hospice placement  Other problems Normocytic anemia Hypokalemia Elevated LFTs Hypothyroidism GERD     - patient is comfort care  measures, no further treatment or workup of the conditions  Above   Addendum: As per hospice, she was not appropriate for residential hospice.  Patient returning to home with home hospice services.  She can resume her previous medications as per hospice agency if decided to do so  Discharge Diagnoses:  Active Problems:   Peritoneal carcinomatosis [facility])    Discharge Instructions  Discharge Instructions    Diet general   Complete by: As directed      Allergies as of 12/28/2019      Reactions   Nitrofurantoin Nausea Only, Rash   Other reaction(s): Vomiting   Paclitaxel Itching   Flushing, redness    Sulfa Antibiotics    Leg swelling   Ceftriaxone Nausea And Vomiting      Medication List    STOP taking these medications   acetaminophen 325 MG tablet Commonly known as: TYLENOL   ciprofloxacin 500 MG tablet Commonly known as: CIPRO   CYSTEX PO   levothyroxine 125 MCG tablet Commonly known as: SYNTHROID   oxyCODONE-acetaminophen 5-325 MG tablet Commonly known as: PERCOCET/ROXICET   pantoprazole 40 MG tablet Commonly known as: PROTONIX   Potassium Chloride ER 20 MEQ Tbcr   prochlorperazine 10 MG tablet Commonly known as: COMPAZINE   tiZANidine 2 MG tablet Commonly known as: ZANAFLEX   traZODone 50 MG tablet Commonly known as: DESYREL       Allergies  Allergen Reactions  . Nitrofurantoin Nausea Only and Rash    Other reaction(s): Vomiting  . Paclitaxel Itching    Flushing, redness   . Sulfa Antibiotics     Leg swelling  .  Ceftriaxone Nausea And Vomiting    Consultations:  None   Procedures/Studies: DG Chest 1 View  Result Date: 12/19/2019 CLINICAL DATA:  Pulmonary edema. EXAM: CHEST  1 VIEW COMPARISON:  12/18/2019 FINDINGS: Heart size remains within normal limits. Low lung volumes are again seen. Bibasilar infiltrates are again seen, with probable mild worsening in the left lung base when allowing for changes in positioning. Tiny pleural  effusions cannot be excluded. IMPRESSION: Bibasilar infiltrates, with probable mild worsening in the left lung base. Electronically Signed   By: Danae Orleans M.D.   On: 12/19/2019 05:01   CT ABDOMEN PELVIS W CONTRAST  Result Date: 12/27/2019 CLINICAL DATA:  Abdominal distention. EXAM: CT ABDOMEN AND PELVIS WITH CONTRAST TECHNIQUE: Multidetector CT imaging of the abdomen and pelvis was performed using the standard protocol following bolus administration of intravenous contrast. CONTRAST:  OMNIPAQUE IOHEXOL 300 MG/ML  SOLN COMPARISON:  12/18/2019 FINDINGS: Lower chest: Interval increase in right pleural effusion now moderate to large. Bibasilar collapse/consolidative opacity is progressive in there is persistent tiny left pleural effusion. Hepatobiliary: No suspicious focal abnormality within the liver parenchyma. Probable gallbladder sludge. No intrahepatic or extrahepatic biliary dilation. Pancreas: No focal mass lesion. No dilatation of the main duct. No intraparenchymal cyst. No peripancreatic edema. Spleen: No splenomegaly. No focal mass lesion. Adrenals/Urinary Tract: No adrenal nodule or mass. Left double-J internal ureteral stent is again noted. Mild right hydroureteronephrosis is stable to minimally progressed since prior. Irregular bladder wall thickening evident with areas of nodularity again noted. Stomach/Bowel: Stomach is unremarkable. No gastric wall thickening. No evidence of outlet obstruction. Duodenum is normally positioned as is the ligament of Treitz. No small bowel wall thickening. No small bowel dilatation. No gross colonic mass. No colonic wall thickening. Vascular/Lymphatic: No abdominal aortic aneurysm. No abdominal aortic atherosclerotic calcification. There is no gastrohepatic or hepatoduodenal ligament lymphadenopathy. No retroperitoneal or mesenteric lymphadenopathy. 1.5 cm short axis left external iliac node on 75/2 is similar to prior. Borderline lymphadenopathy along the  right pelvic sidewall is not substantially changed. Reproductive: As on prior study, there appears to be marked fluid-filled distention of the endometrial cavity. Small cystic lesion noted right adnexal space. Other: Interval decrease in volume of intraperitoneal fluid with intraperitoneal drain identified lower anterior pelvis. Numerous areas of irregular peritoneal thickening are associated with progression of irregular and nodular soft tissue density in the omentum. Mesenteric congestion noted in the central abdomen. Musculoskeletal: Mixed lytic and sclerotic lesion in the left ischial tuberosity is similar to prior. Sclerotic lesion noted left femoral head, as before. Heterogeneity of sacral mineralization is similar. IMPRESSION: 1. Interval increase in right pleural effusion now moderate to large. Bibasilar collapse/consolidative opacity is progressive in there is persistent tiny left pleural effusion. 2. Interval decrease in volume of intraperitoneal fluid with intraperitoneal drain identified lower anterior pelvis. 3. Numerous areas of irregular peritoneal thickening associated with progression of irregular and nodular soft tissue density in the omentum. Imaging features are consistent with reported history of peritoneal carcinomatosis. 4. Persistent marked fluid-filled distention of the endometrial cavity. 5. Similar appearance of irregular bladder wall thickening with areas of nodularity. 6. Similar appearance of mild lymphadenopathy in the pelvis. 7. Similar appearance of mixed lytic and sclerotic lesion in the left ischial tuberosity and left femoral head. 8. Left double-J internal ureteral stent with stable mild right hydroureteronephrosis. 9. Aortic Atherosclerosis (ICD10-I70.0). Electronically Signed   By: Kennith Center M.D.   On: 12/27/2019 07:45   CT ABDOMEN PELVIS W CONTRAST  Result Date:  12/18/2019 CLINICAL DATA:  Abdomen distension history of cervical cancer EXAM: CT ABDOMEN AND PELVIS WITH  CONTRAST TECHNIQUE: Multidetector CT imaging of the abdomen and pelvis was performed using the standard protocol following bolus administration of intravenous contrast. CONTRAST:  151mL OMNIPAQUE IOHEXOL 300 MG/ML  SOLN COMPARISON:  None. FINDINGS: Lower chest: Lung bases demonstrate small bilateral pleural effusions and dependent lower lobe atelectasis. Borderline cardiomegaly. Small hiatal hernia. Hepatobiliary: No focal hepatic abnormality. No calcified gallstone or biliary dilatation Pancreas: Unremarkable. No pancreatic ductal dilatation or surrounding inflammatory changes. Spleen: Normal in size without focal abnormality. Adrenals/Urinary Tract: Adrenal glands are normal. Ureteral stent within the left kidney. There is mild bilateral hydronephrosis. Abnormal appearance of the bladder with irregular areas of wall thickening. Masslike thickening of the posterior wall of the bladder with soft tissue density near the left distal pigtail. Stomach/Bowel: The stomach is nonenlarged. Fluid-filled nondistended small bowel. No acute colon wall thickening. Vascular/Lymphatic: Nonaneurysmal aorta. Subcentimeter Peri aortic lymph nodes. Mildly enlarged bilateral nodes adjacent to the proximal external iliac arteries. On the right, this measures 11 mm and on the left, measures 14 mm. Reproductive: Fluid-filled structure within the central pelvis which appears contiguous with the vagina and presumably represents an obstructed uterus. Fluid visible within the vaginal cuff. Irregular soft tissue density in the region of the cervix which appears contiguous with irregular soft tissue density at the posterior aspect of the bladder concerning for bladder invasion. Possible dilated right fallopian tube. Other: No free air. Abdominal drainage catheter in the right lower quadrant with the tip terminating in the left mid abdomen. Moderate abdominal ascites and pelvic ascites. Nodular infiltration of the left upper quadrant mesentery  and the omentum, consistent with peritoneal metastatic disease. Mild diffuse peritoneal enhancement. Rim enhancing loculated fluid collections within the pelvis. The largest collection is seen posteriorly and measures 6.3 x 3.2 cm. Musculoskeletal: Sclerotic lesions within the left iliac bone, right sacrum and left ischium, concerning for metastatic disease. IMPRESSION: 1. Moderate abdominal and pelvic ascites with peritoneal drainage catheter in place. Mild diffuse peritoneal enhancement could be secondary to metastatic disease or peritonitis. Loculated/mildly rim enhancing fluid collection within the deep posterior pelvis measuring 6.3 cm. 2. Irregular soft tissue density in the region of the cervix, appears contiguous with lobulated soft tissue density at the posterior wall of the bladder concerning for invasion. There are other areas of irregular bladder wall thickening, concerning for neoplastic involvement. Nodular infiltration of the omentum and left upper quadrant mesentery consistent with peritoneal metastatic disease. Left ureteral stent in place. There is mild bilateral hydronephrosis, suspect secondary to mild obstruction from soft tissue mass at the posterior aspect of the bladder. 3. Fluid-filled structure within the central pelvis, contiguous with fluid-filled vagina presumably represents an obstructed uterus. 4. Small bilateral pleural effusions. 5. Metastatic disease of the pelvic bones. Enlarged pelvic lymph nodes consistent with metastatic disease Electronically Signed   By: Donavan Foil M.D.   On: 12/18/2019 18:01   DG Chest Portable 1 View  Result Date: 12/27/2019 CLINICAL DATA:  Cough and weakness EXAM: PORTABLE CHEST 1 VIEW COMPARISON:  Eight days ago FINDINGS: Low volume chest, greater on the right where there is diaphragm elevation and hazy density. No edema or pneumothorax. Normal heart size. IMPRESSION: Low volume chest with hazy opacity at the right base which is likely atelectasis  and pleural fluid based on a 12/18/2019 abdominal CT. Electronically Signed   By: Monte Fantasia M.D.   On: 12/27/2019 04:21   DG Chest Portable  1 View  Result Date: 12/18/2019 CLINICAL DATA:  Chest pain, cough and weakness. EXAM: PORTABLE CHEST 1 VIEW COMPARISON:  03/10/2018 FINDINGS: Poor inspiration. Hazy/patchy infiltrates in the lower lungs. Small pleural effusion on the right. Findings consistent with bibasilar pneumonia. No dense consolidation or lobar collapse. No significant bone finding. IMPRESSION: Hazy/patchy infiltrates in the lower lungs consistent with bibasilar pneumonia. Small right effusion. Electronically Signed   By: Nelson Chimes M.D.   On: 12/18/2019 16:00       Subjective:  Patient seen and examined at the bedside this morning.  Weak, lying on the bed without any new problems.  Expressed her interest in residential hospice  Discharge Exam: Vitals:   12/27/19 2020 12/28/19 0527  BP: 140/81 126/80  Pulse: (!) 118 (!) 108  Resp: (!) 26 20  Temp: 99.8 F (37.7 C) 98.3 F (36.8 C)  SpO2: 93% 92%   Vitals:   12/27/19 1231 12/27/19 1424 12/27/19 2020 12/28/19 0527  BP: (!) 144/87 (!) 149/90 140/81 126/80  Pulse: (!) 118 (!) 121 (!) 118 (!) 108  Resp: 20 18 (!) 26 20  Temp: 99.4 F (37.4 C) 98.6 F (37 C) 99.8 F (37.7 C) 98.3 F (36.8 C)  TempSrc: Oral Oral Oral Oral  SpO2: 96% 96% 93% 92%  Weight:      Height:        General: Pt is alert, awake,  Cardiovascular: RRR, S1/S2 +, no rubs, no gallops Respiratory: CTA bilaterally, no wheezing, no rhonchi Abdominal: Distended, bowel sounds + Extremities: Severe bilateral lower extremity edema, no cyanosis    The results of significant diagnostics from this hospitalization (including imaging, microbiology, ancillary and laboratory) are listed below for reference.     Microbiology: Recent Results (from the past 240 hour(s))  Blood culture (routine x 2)     Status: None   Collection Time: 12/18/19  2:45  PM   Specimen: BLOOD LEFT ARM  Result Value Ref Range Status   Specimen Description   Final    BLOOD LEFT ARM Performed at [facility], [address] [place], [place] [address]    Special Requests   Final    BOTTLES DRAWN AEROBIC AND ANAEROBIC Blood Culture adequate volume Performed at [facility], [address] [place], [place] [address]    Culture   Final    NO GROWTH 5 DAYS Performed at [facility], [place] [address], [place], [place] [address]    Report Status 12/23/2019 FINAL  Final  Blood culture (routine x 2)     Status: None   Collection Time: 12/18/19  2:55 PM   Specimen: BLOOD RIGHT ARM  Result Value Ref Range Status   Specimen Description   Final    BLOOD RIGHT ARM Performed at [facility], [address] [place], [place] [address]    Special Requests   Final    BOTTLES DRAWN AEROBIC AND ANAEROBIC Blood Culture adequate volume Performed at [facility], [address] [place], [place] [address]    Culture   Final    NO GROWTH 5 DAYS Performed at [facility], [place] [address] [place], [place] [address]    Report Status 12/23/2019 FINAL  Final  Culture, body fluid-bottle     Status: None   Collection Time: 12/18/19  4:00 PM   Specimen: Fluid  Result Value Ref Range Status   Specimen Description FLUID PERITONEAL  Final  Special Requests BOTTLES DRAWN AEROBIC AND ANAEROBIC  Final   Culture   Final    NO GROWTH 5 DAYS Performed at [facility], [place] [address] [place], [place] [address]    Report Status 12/23/2019 FINAL  Final  Gram stain     Status: None   Collection Time: 12/18/19  4:00 PM   Specimen: Fluid  Result Value Ref Range Status   Specimen Description FLUID PERITONEAL  Final   Special Requests NONE  Final   Gram Stain   Final    MODERATE WBC PRESENT,BOTH PMN AND MONONUCLEAR NO ORGANISMS SEEN Performed at [facility], [place] [address] [place], [place] [address]    Report  Status 12/18/2019 FINAL  Final  Resp Panel by RT-PCR (Flu A&B, Covid) Nasopharyngeal Swab     Status: None   Collection Time: 12/18/19  4:19 PM   Specimen: Nasopharyngeal Swab; Nasopharyngeal(NP) swabs in vial transport medium  Result Value Ref Range Status   SARS Coronavirus 2 by RT PCR NEGATIVE NEGATIVE Final    Comment: (NOTE) SARS-CoV-2 target nucleic acids are NOT DETECTED.  The SARS-CoV-2 RNA is generally detectable in upper respiratory specimens during the acute phase of infection. The lowest concentration of SARS-CoV-2 viral copies this assay can detect is 138 copies/mL. A negative result does not preclude SARS-Cov-2 infection and should not be used as the sole basis for treatment or other patient management decisions. A negative result may occur with  improper specimen collection/handling, submission of specimen other than nasopharyngeal swab, presence of viral mutation(s) within the areas targeted by this assay, and inadequate number of viral copies(<138 copies/mL). A negative result must be combined with clinical observations, patient history, and epidemiological information. The expected result is Negative.  Fact Sheet for Patients:  EntrepreneurPulse.com.au  Fact Sheet for Healthcare Providers:  IncredibleEmployment.be  This test is no t yet approved or cleared by the [place] FDA and  has been authorized for detection and/or diagnosis of SARS-CoV-2 by FDA under an Emergency Use Authorization (EUA). This EUA will remain  in effect (meaning this test can be used) for the duration of the COVID-19 declaration under Section 564(b)(1) of the Act, 21 U.S.C.section 360bbb-3(b)(1), unless the authorization is terminated  or revoked sooner.       Influenza A by PCR NEGATIVE NEGATIVE Final   Influenza B by PCR NEGATIVE NEGATIVE Final    Comment: (NOTE) The Xpert Xpress SARS-CoV-2/FLU/RSV plus assay is intended as an aid in the  diagnosis of influenza from Nasopharyngeal swab specimens and should not be used as a sole basis for treatment. Nasal washings and aspirates are unacceptable for Xpert Xpress SARS-CoV-2/FLU/RSV testing.  Fact Sheet for Patients: EntrepreneurPulse.com.au  Fact Sheet for Healthcare Providers: IncredibleEmployment.be  This test is not yet approved or cleared by the [place] FDA and has been authorized for detection and/or diagnosis of SARS-CoV-2 by FDA under an Emergency Use Authorization (EUA). This EUA will remain in effect (meaning this test can be used) for the duration of the COVID-19 declaration under Section 564(b)(1) of the Act, 21 U.S.C. section 360bbb-3(b)(1), unless the authorization is terminated or revoked.  Performed at [facility], [address] [place], [place] [address]   Urine culture     Status: Abnormal   Collection Time: 12/18/19  8:30 PM   Specimen: In/Out Cath Urine  Result Value Ref Range Status   Specimen Description   Final    IN/OUT CATH URINE Performed at [facility], [address] [place],  [address]    Special Requests   Final    NONE Performed at [facility], [address] [place], [place] [address]    Culture 300 COLONIES/mL YEAST (A)  Final   Report Status 12/21/2019 FINAL  Final  Resp Panel by RT-PCR (Flu A&B, Covid) Nasopharyngeal Swab     Status: None   Collection Time: 12/27/19  4:10 AM   Specimen: Nasopharyngeal Swab; Nasopharyngeal(NP) swabs in vial transport medium  Result Value Ref Range Status   SARS Coronavirus 2 by RT PCR NEGATIVE NEGATIVE Final    Comment: (NOTE) SARS-CoV-2 target nucleic acids are NOT DETECTED.  The SARS-CoV-2 RNA is generally detectable in upper respiratory specimens during the acute phase of infection. The lowest concentration of SARS-CoV-2 viral copies this assay can detect is 138 copies/mL. A negative result does not  preclude SARS-Cov-2 infection and should not be used as the sole basis for treatment or other patient management decisions. A negative result may occur with  improper specimen collection/handling, submission of specimen other than nasopharyngeal swab, presence of viral mutation(s) within the areas targeted by this assay, and inadequate number of viral copies(<138 copies/mL). A negative result must be combined with clinical observations, patient history, and epidemiological information. The expected result is Negative.  Fact Sheet for Patients:  EntrepreneurPulse.com.au  Fact Sheet for Healthcare Providers:  IncredibleEmployment.be  This test is no t yet approved or cleared by the [place] FDA and  has been authorized for detection and/or diagnosis of SARS-CoV-2 by FDA under an Emergency Use Authorization (EUA). This EUA will remain  in effect (meaning this test can be used) for the duration of the COVID-19 declaration under Section 564(b)(1) of the Act, 21 U.S.C.section 360bbb-3(b)(1), unless the authorization is terminated  or revoked sooner.       Influenza A by PCR NEGATIVE NEGATIVE Final   Influenza B by PCR NEGATIVE NEGATIVE Final    Comment: (NOTE) The Xpert Xpress SARS-CoV-2/FLU/RSV plus assay is intended as an aid in the diagnosis of influenza from Nasopharyngeal swab specimens and should not be used as a sole basis for treatment. Nasal washings and aspirates are unacceptable for Xpert Xpress SARS-CoV-2/FLU/RSV testing.  Fact Sheet for Patients: EntrepreneurPulse.com.au  Fact Sheet for Healthcare Providers: IncredibleEmployment.be  This test is not yet approved or cleared by the [place] FDA and has been authorized for detection and/or diagnosis of SARS-CoV-2 by FDA under an Emergency Use Authorization (EUA). This EUA will remain in effect (meaning this test can be used) for the  duration of the COVID-19 declaration under Section 564(b)(1) of the Act, 21 U.S.C. section 360bbb-3(b)(1), unless the authorization is terminated or revoked.  Performed at [facility], [address] [place], [place] [address]      Labs: BNP (last 3 results) No results for input(s): BNP in the last 8760 hours. Basic Metabolic Panel: Recent Labs  Lab 12/24/19 0925 12/27/19 0410  NA 139 136  K 3.0* 3.2*  CL 105 103  CO2 23 21*  GLUCOSE 113* 135*  BUN 6* 10  CREATININE 0.57 0.75  CALCIUM 7.3* 7.7*   Liver Function Tests: Recent Labs  Lab 12/24/19 0925 12/27/19 0410  AST 20 26  ALT 12 12  ALKPHOS 180* 141*  BILITOT 0.4 0.4  PROT 5.2* 5.3*  ALBUMIN 1.9* 1.9*   Recent Labs  Lab 12/27/19 0410  LIPASE 16   No results for input(s): AMMONIA  in the last 168 hours. CBC: Recent Labs  Lab 12/24/19 0925 12/27/19 0410  WBC 14.1* 20.5*  NEUTROABS  --  17.0*  HGB 9.2* 9.9*  HCT 29.9* 30.6*  MCV 89.5 86.9  PLT 1,506* 1,628*   Cardiac Enzymes: No results for input(s): CKTOTAL, CKMB, CKMBINDEX, TROPONINI in the last 168 hours. BNP: Invalid input(s): POCBNP CBG: No results for input(s): GLUCAP in the last 168 hours. D-Dimer No results for input(s): DDIMER in the last 72 hours. Hgb A1c No results for input(s): HGBA1C in the last 72 hours. Lipid Profile No results for input(s): CHOL, HDL, LDLCALC, TRIG, CHOLHDL, LDLDIRECT in the last 72 hours. Thyroid function studies No results for input(s): TSH, T4TOTAL, T3FREE, THYROIDAB in the last 72 hours.  Invalid input(s): FREET3 Anemia work up No results for input(s): VITAMINB12, FOLATE, FERRITIN, TIBC, IRON, RETICCTPCT in the last 72 hours. Urinalysis    Component Value Date/Time   COLORURINE YELLOW 12/27/2019 0405   APPEARANCEUR HAZY (A) 12/27/2019 0405   LABSPEC 1.020 12/27/2019 0405   PHURINE 5.0 12/27/2019 0405   GLUCOSEU NEGATIVE 12/27/2019 0405   HGBUR MODERATE (A) 12/27/2019 0405   BILIRUBINUR  NEGATIVE 12/27/2019 0405   KETONESUR NEGATIVE 12/27/2019 0405   PROTEINUR NEGATIVE 12/27/2019 0405   NITRITE NEGATIVE 12/27/2019 0405   LEUKOCYTESUR SMALL (A) 12/27/2019 0405   Sepsis Labs Invalid input(s): PROCALCITONIN,  WBC,  LACTICIDVEN Microbiology Recent Results (from the past 240 hour(s))  Blood culture (routine x 2)     Status: None   Collection Time: 12/18/19  2:45 PM   Specimen: BLOOD LEFT ARM  Result Value Ref Range Status   Specimen Description   Final    BLOOD LEFT ARM Performed at [facility], [address] [place], [place] [address]    Special Requests   Final    BOTTLES DRAWN AEROBIC AND ANAEROBIC Blood Culture adequate volume Performed at [facility], [address] [place], [place] [address]    Culture   Final    NO GROWTH 5 DAYS Performed at [facility], [place] [address] [place], [place] [address]    Report Status 12/23/2019 FINAL  Final  Blood culture (routine x 2)     Status: None   Collection Time: 12/18/19  2:55 PM   Specimen: BLOOD RIGHT ARM  Result Value Ref Range Status   Specimen Description   Final    BLOOD RIGHT ARM Performed at [facility], [address] [place], [place] [address]    Special Requests   Final    BOTTLES DRAWN AEROBIC AND ANAEROBIC Blood Culture adequate volume Performed at [facility], [address], [place], [place] [address]    Culture   Final    NO GROWTH 5 DAYS Performed at [facility], [place] [address] [place], [place] [address]    Report Status 12/23/2019 FINAL  Final  Culture, body fluid-bottle     Status: None   Collection Time: 12/18/19  4:00 PM   Specimen: Fluid  Result Value Ref Range Status   Specimen Description FLUID PERITONEAL  Final   Special Requests BOTTLES DRAWN AEROBIC AND ANAEROBIC  Final   Culture   Final    NO GROWTH 5 DAYS Performed at [facility], [place] [address], [place], [place] [address]    Report Status 12/23/2019  FINAL  Final  Gram stain     Status: None   Collection Time:  12/18/19  4:00 PM   Specimen: Fluid  Result Value Ref Range Status   Specimen Description FLUID PERITONEAL  Final   Special Requests NONE  Final   Gram Stain   Final    MODERATE WBC PRESENT,BOTH PMN AND MONONUCLEAR NO ORGANISMS SEEN Performed at [facility], [address], [place], [place] [address]    Report Status 12/18/2019 FINAL  Final  Resp Panel by RT-PCR (Flu A&B, Covid) Nasopharyngeal Swab     Status: None   Collection Time: 12/18/19  4:19 PM   Specimen: Nasopharyngeal Swab; Nasopharyngeal(NP) swabs in vial transport medium  Result Value Ref Range Status   SARS Coronavirus 2 by RT PCR NEGATIVE NEGATIVE Final    Comment: (NOTE) SARS-CoV-2 target nucleic acids are NOT DETECTED.  The SARS-CoV-2 RNA is generally detectable in upper respiratory specimens during the acute phase of infection. The lowest concentration of SARS-CoV-2 viral copies this assay can detect is 138 copies/mL. A negative result does not preclude SARS-Cov-2 infection and should not be used as the sole basis for treatment or other patient management decisions. A negative result may occur with  improper specimen collection/handling, submission of specimen other than nasopharyngeal swab, presence of viral mutation(s) within the areas targeted by this assay, and inadequate number of viral copies(<138 copies/mL). A negative result must be combined with clinical observations, patient history, and epidemiological information. The expected result is Negative.  Fact Sheet for Patients:  EntrepreneurPulse.com.au  Fact Sheet for Healthcare Providers:  IncredibleEmployment.be  This test is no t yet approved or cleared by the [place] FDA and  has been authorized for detection and/or diagnosis of SARS-CoV-2 by FDA under an Emergency Use Authorization (EUA). This EUA will remain  in effect (meaning this test  can be used) for the duration of the COVID-19 declaration under Section 564(b)(1) of the Act, 21 U.S.C.section 360bbb-3(b)(1), unless the authorization is terminated  or revoked sooner.       Influenza A by PCR NEGATIVE NEGATIVE Final   Influenza B by PCR NEGATIVE NEGATIVE Final    Comment: (NOTE) The Xpert Xpress SARS-CoV-2/FLU/RSV plus assay is intended as an aid in the diagnosis of influenza from Nasopharyngeal swab specimens and should not be used as a sole basis for treatment. Nasal washings and aspirates are unacceptable for Xpert Xpress SARS-CoV-2/FLU/RSV testing.  Fact Sheet for Patients: EntrepreneurPulse.com.au  Fact Sheet for Healthcare Providers: IncredibleEmployment.be  This test is not yet approved or cleared by the [place] FDA and has been authorized for detection and/or diagnosis of SARS-CoV-2 by FDA under an Emergency Use Authorization (EUA). This EUA will remain in effect (meaning this test can be used) for the duration of the COVID-19 declaration under Section 564(b)(1) of the Act, 21 U.S.C. section 360bbb-3(b)(1), unless the authorization is terminated or revoked.  Performed at [facility], [address], [place], [place] [address]   Urine culture     Status: Abnormal   Collection Time: 12/18/19  8:30 PM   Specimen: In/Out Cath Urine  Result Value Ref Range Status   Specimen Description   Final    IN/OUT CATH URINE Performed at [facility], [address] [place], [place] [address]    Special Requests   Final    NONE Performed at [facility], [address] [place], [place] [address]    Culture 300 COLONIES/mL YEAST (A)  Final   Report Status 12/21/2019 FINAL  Final  Resp Panel  by RT-PCR (Flu A&B, Covid) Nasopharyngeal Swab     Status: None   Collection Time: 12/27/19  4:10 AM   Specimen: Nasopharyngeal Swab; Nasopharyngeal(NP) swabs in vial transport medium  Result  Value Ref Range Status   SARS Coronavirus 2 by RT PCR NEGATIVE NEGATIVE Final    Comment: (NOTE) SARS-CoV-2 target nucleic acids are NOT DETECTED.  The SARS-CoV-2 RNA is generally detectable in upper respiratory specimens during the acute phase of infection. The lowest concentration of SARS-CoV-2 viral copies this assay can detect is 138 copies/mL. A negative result does not preclude SARS-Cov-2 infection and should not be used as the sole basis for treatment or other patient management decisions. A negative result may occur with  improper specimen collection/handling, submission of specimen other than nasopharyngeal swab, presence of viral mutation(s) within the areas targeted by this assay, and inadequate number of viral copies(<138 copies/mL). A negative result must be combined with clinical observations, patient history, and epidemiological information. The expected result is Negative.  Fact Sheet for Patients:  EntrepreneurPulse.com.au  Fact Sheet for Healthcare Providers:  IncredibleEmployment.be  This test is no t yet approved or cleared by the [place] FDA and  has been authorized for detection and/or diagnosis of SARS-CoV-2 by FDA under an Emergency Use Authorization (EUA). This EUA will remain  in effect (meaning this test can be used) for the duration of the COVID-19 declaration under Section 564(b)(1) of the Act, 21 U.S.C.section 360bbb-3(b)(1), unless the authorization is terminated  or revoked sooner.       Influenza A by PCR NEGATIVE NEGATIVE Final   Influenza B by PCR NEGATIVE NEGATIVE Final    Comment: (NOTE) The Xpert Xpress SARS-CoV-2/FLU/RSV plus assay is intended as an aid in the diagnosis of influenza from Nasopharyngeal swab specimens and should not be used as a sole basis for treatment. Nasal washings and aspirates are unacceptable for Xpert Xpress SARS-CoV-2/FLU/RSV testing.  Fact Sheet for  Patients: EntrepreneurPulse.com.au  Fact Sheet for Healthcare Providers: IncredibleEmployment.be  This test is not yet approved or cleared by the [place] FDA and has been authorized for detection and/or diagnosis of SARS-CoV-2 by FDA under an Emergency Use Authorization (EUA). This EUA will remain in effect (meaning this test can be used) for the duration of the COVID-19 declaration under Section 564(b)(1) of the Act, 21 U.S.C. section 360bbb-3(b)(1), unless the authorization is terminated or revoked.  Performed at [facility], [address] [place], [place] [address]     Please note: You were cared for by a hospitalist during your hospital stay. Once you are discharged, your primary care physician will handle any further medical issues. Please note that NO REFILLS for any discharge medications will be authorized once you are discharged, as it is imperative that you return to your primary care physician (or establish a relationship with a primary care physician if you do not have one) for your post hospital discharge needs so that they can reassess your need for medications and monitor your lab values.    Time coordinating discharge: 40 minutes  SIGNED:   Shelly Coss, MD  [facility] 12/28/2019, 10:08 AM Pager [phone]  If 7PM-7AM, please contact night-coverage [email or web address] Password [email or web address]

## 2020-04-17 IMAGING — CR CHEST - 2 VIEW
2 series · 2 of 2 positions shown · non-contrast
Comparison: None.

CLINICAL DATA: Shortness of breath.

EXAM:
CHEST - 2 VIEW

[w chest pa]
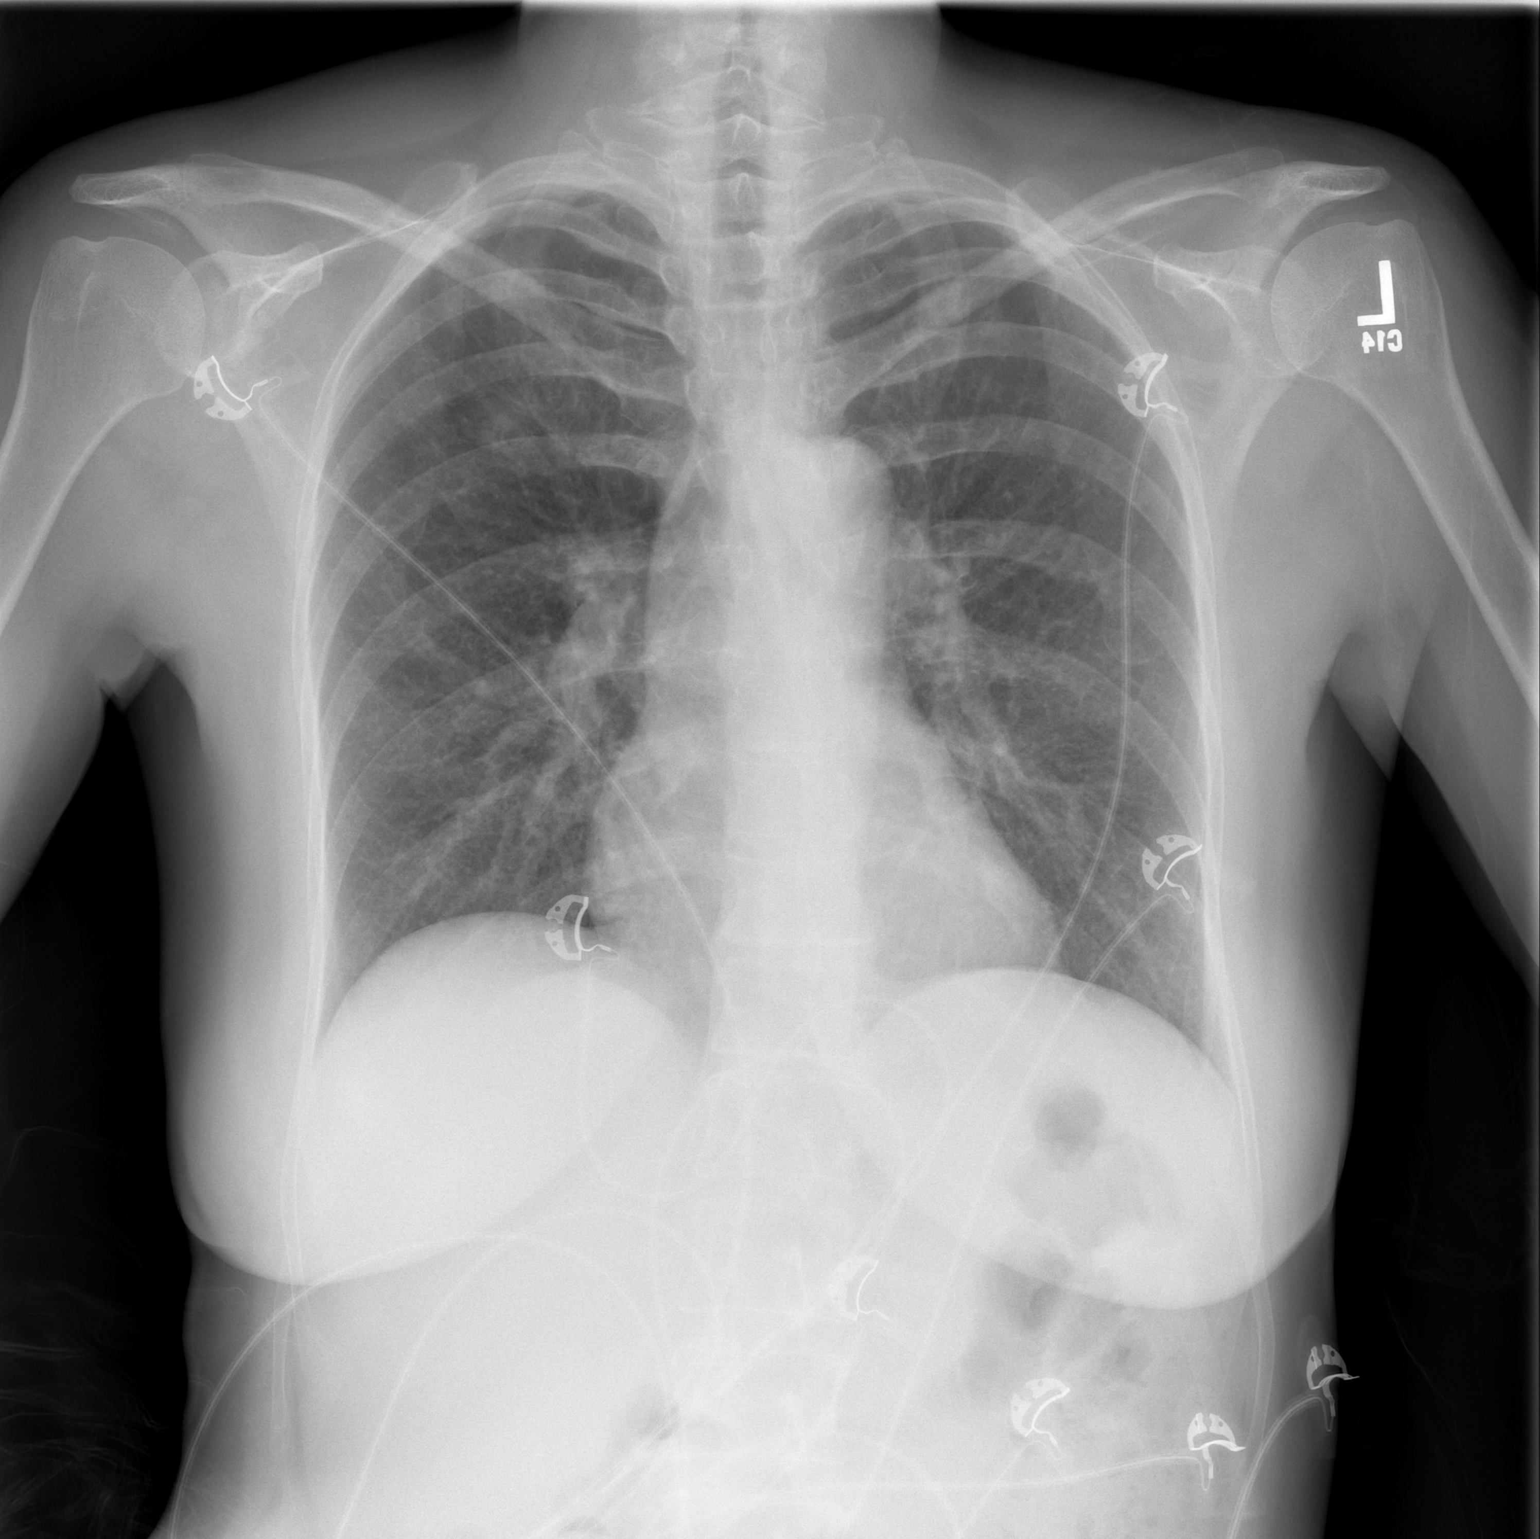

[w chest lat]
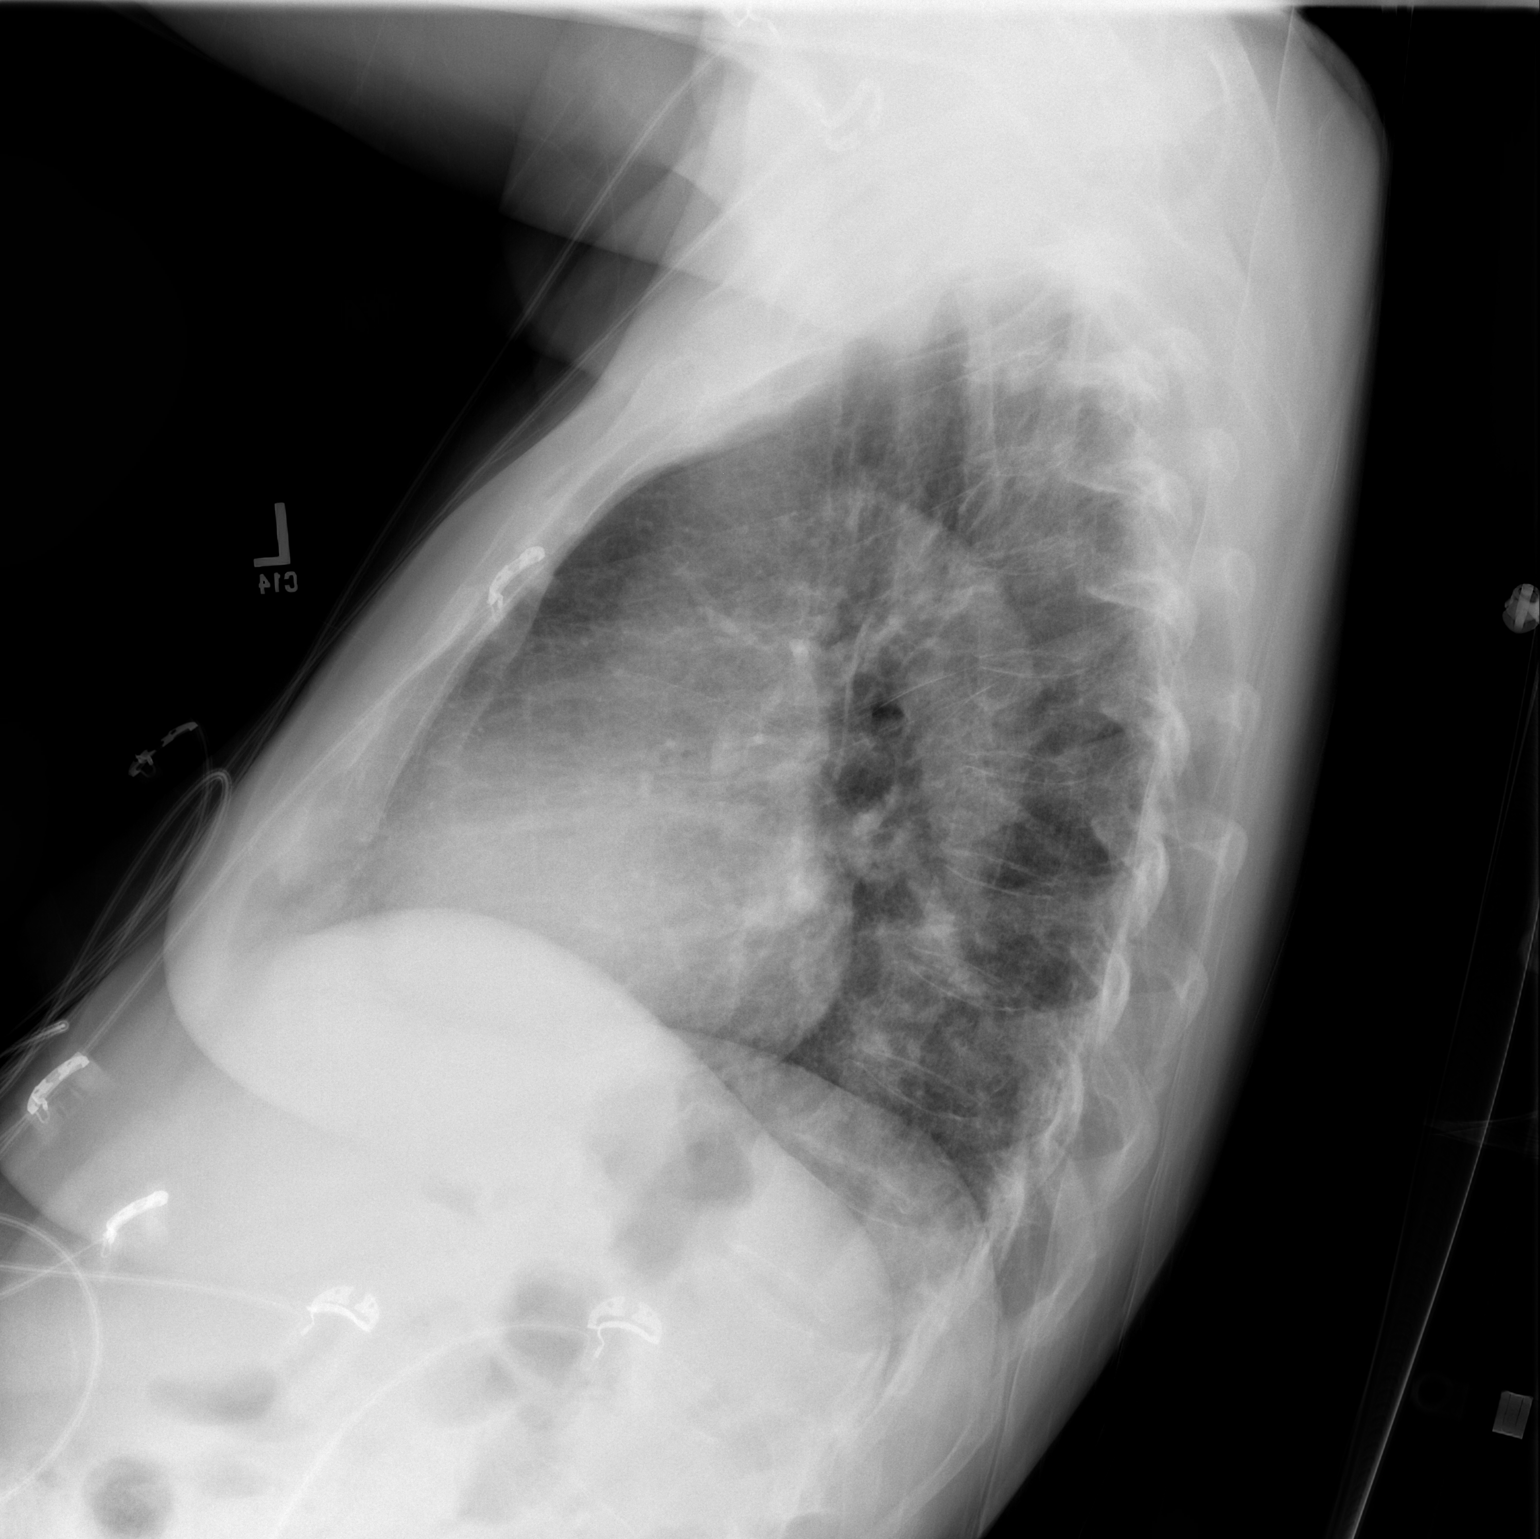

[2 of 2 positions shown; findings below may reference images not displayed]

FINDINGS: The heart size and mediastinal contours are within normal limits.
Both lungs are clear. No pneumothorax or pleural effusion is noted.
The visualized skeletal structures are unremarkable.
IMPRESSION: No active cardiopulmonary disease.

## 2022-02-03 IMAGING — CT CT ABD-PELV W/ CM
2 of 5 series · 15 of 46 positions shown, 17 images · IV contrast (Omnipaque)
Comparison: 12/18/2019

CLINICAL DATA: Abdominal distention.

EXAM:
CT ABDOMEN AND PELVIS WITH CONTRAST
TECHNIQUE: Multidetector CT imaging of the abdomen and pelvis was performed
using the standard protocol following bolus administration of
intravenous contrast.
CONTRAST:  100mL OMNIPAQUE IOHEXOL 300 MG/ML  SOLN

[Series 2: axial st · axial · 0.77mm/px · z∈[+753,+1203]mm · 12 of 102 slices shown, 14 images]
[im 6/102  soft-tissue]
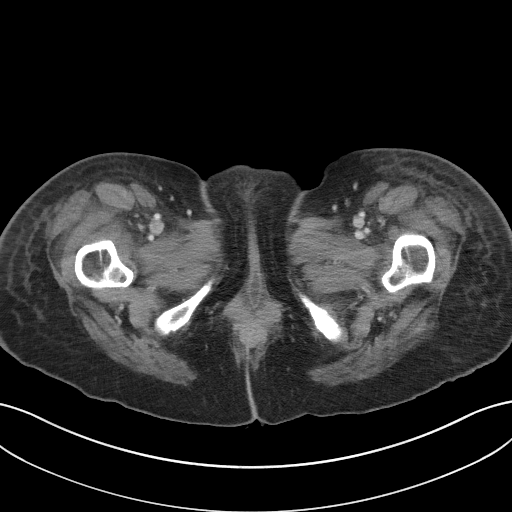
[im 6/102  bone]
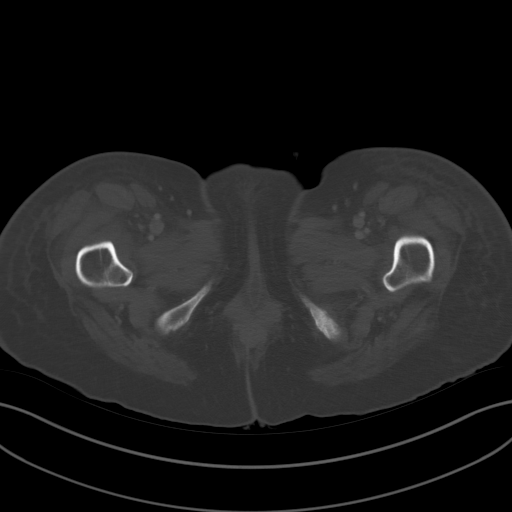
[im 16/102  soft-tissue]
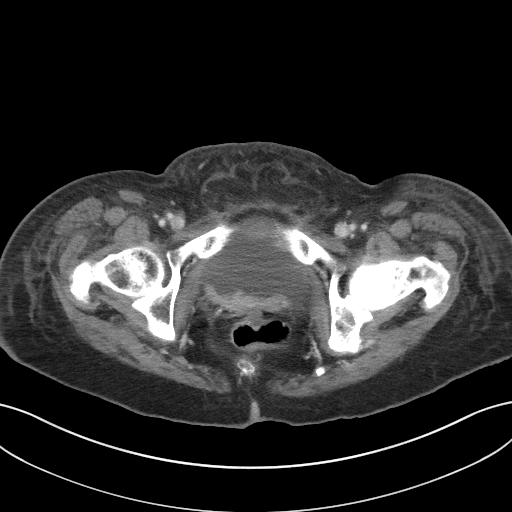
[im 22/102  soft-tissue]
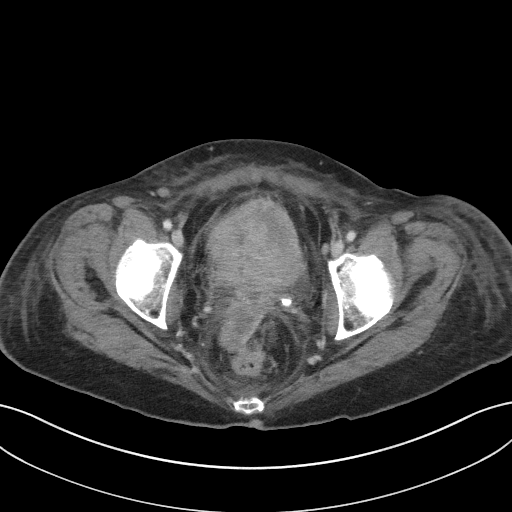
[im 32/102  soft-tissue]
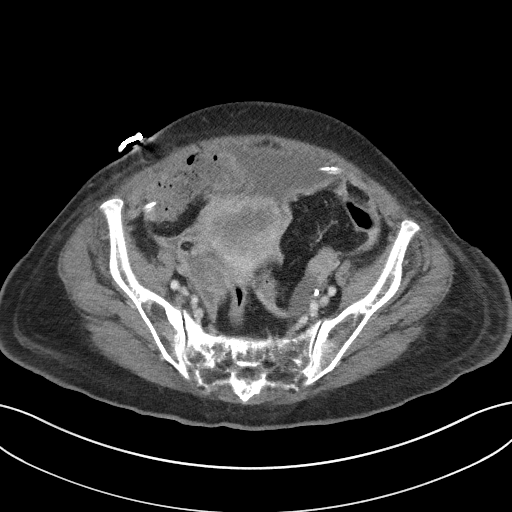
[im 38/102  soft-tissue]
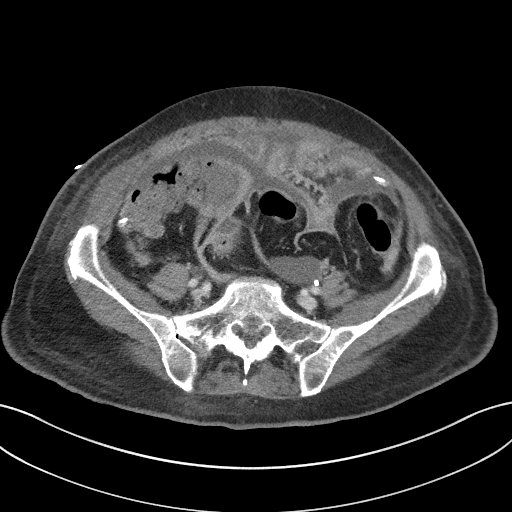
[im 48/102  soft-tissue]
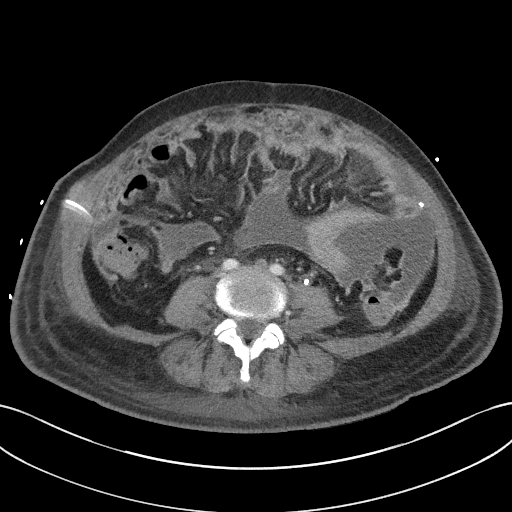
[im 54/102  soft-tissue]
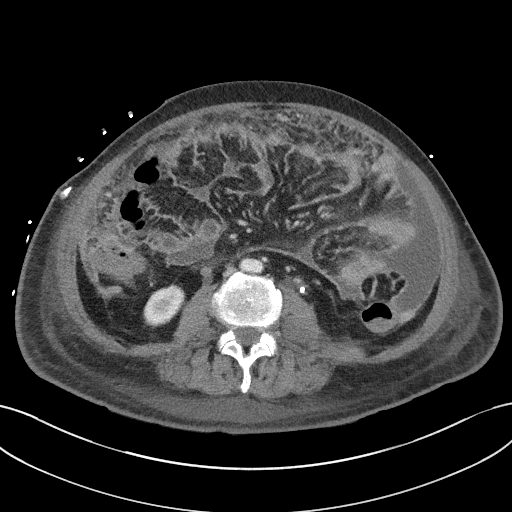
[im 64/102  soft-tissue]
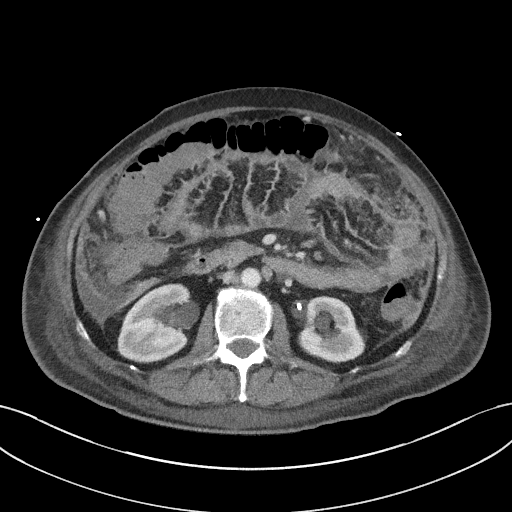
[im 70/102  soft-tissue]
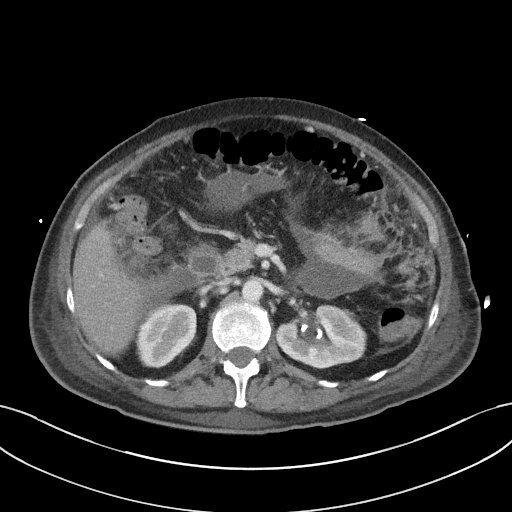
[im 70/102  bone]
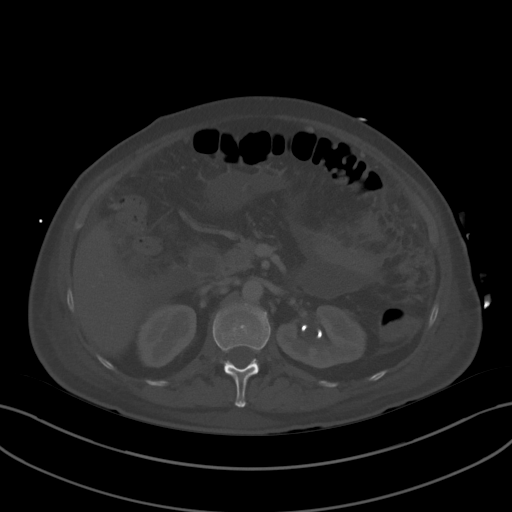
[im 80/102  soft-tissue]
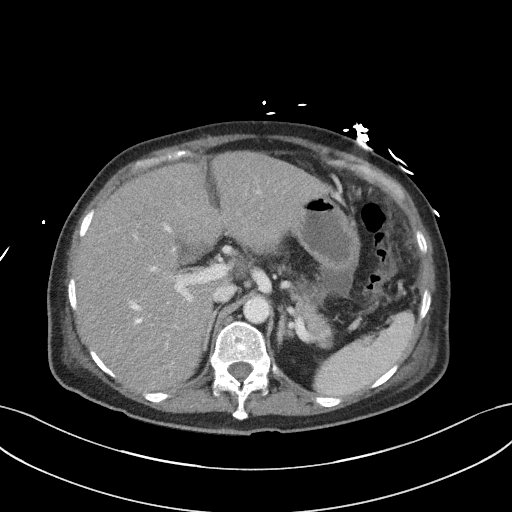
[im 86/102  soft-tissue]
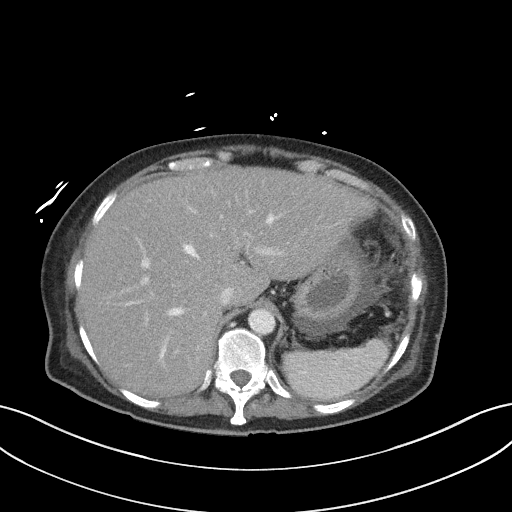
[im 96/102  soft-tissue]
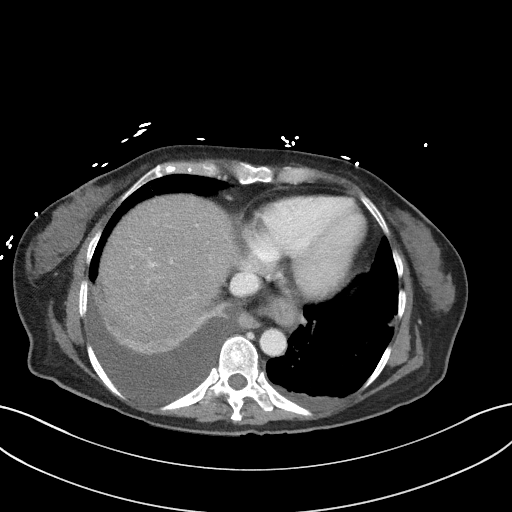

[Series 5: coronal st · coronal · 0.92mm/px · 3 of 96 slices shown]
[im 32/96  soft-tissue]
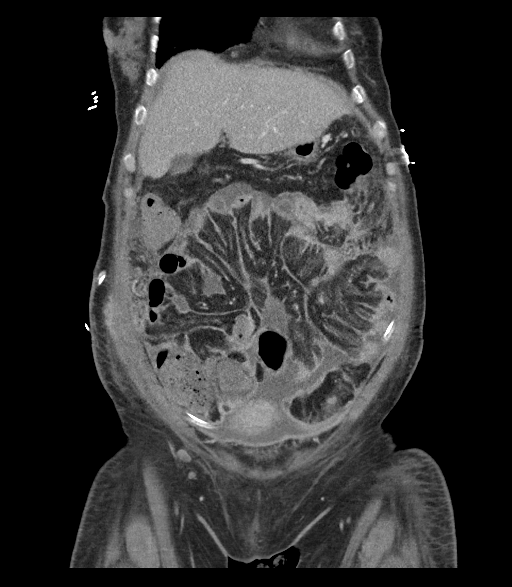
[im 43/96  soft-tissue]
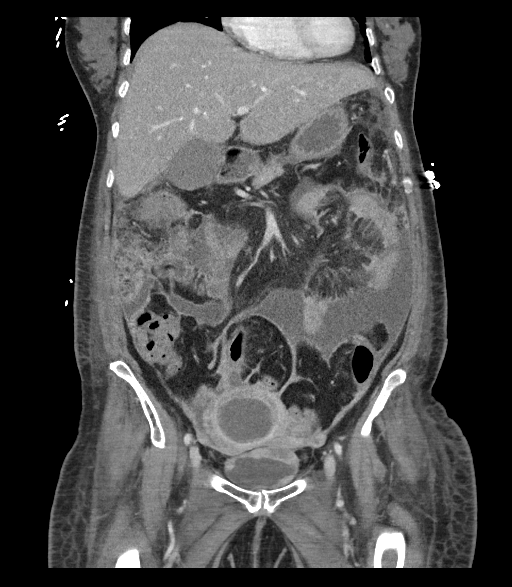
[im 53/96  soft-tissue]
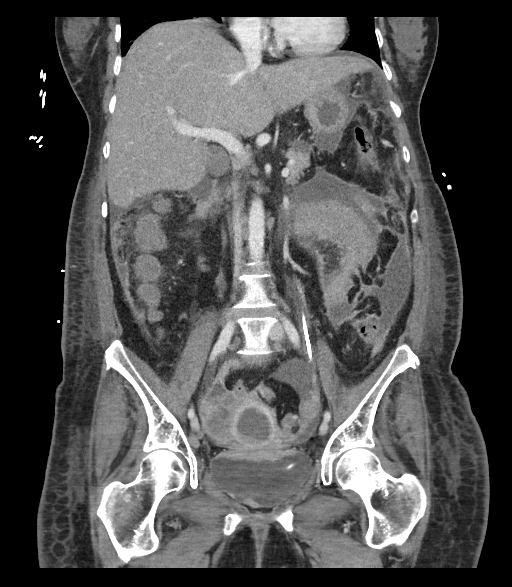

[15 of 46 positions shown; findings below may reference images not displayed]

FINDINGS: Lower chest: Interval increase in right pleural effusion now
moderate to large. Bibasilar collapse/consolidative opacity is
progressive in there is persistent tiny left pleural effusion.

Hepatobiliary: No suspicious focal abnormality within the liver
parenchyma. Probable gallbladder sludge. No intrahepatic or
extrahepatic biliary dilation.

Pancreas: No focal mass lesion. No dilatation of the main duct. No
intraparenchymal cyst. No peripancreatic edema.

Spleen: No splenomegaly. No focal mass lesion.

Adrenals/Urinary Tract: No adrenal nodule or mass. Left double-J
internal ureteral stent is again noted. Mild right
hydroureteronephrosis is stable to minimally progressed since prior.
Irregular bladder wall thickening evident with areas of nodularity
again noted.

Stomach/Bowel: Stomach is unremarkable. No gastric wall thickening.
No evidence of outlet obstruction. Duodenum is normally positioned
as is the ligament of Treitz. No small bowel wall thickening. No
small bowel dilatation. No gross colonic mass. No colonic wall
thickening.

Vascular/Lymphatic: No abdominal aortic aneurysm. No abdominal
aortic atherosclerotic calcification. There is no gastrohepatic or
hepatoduodenal ligament lymphadenopathy. No retroperitoneal or
mesenteric lymphadenopathy. 1.5 cm short axis left external iliac
node on 75/2 is similar to prior. Borderline lymphadenopathy along
the right pelvic sidewall is not substantially changed.

Reproductive: As on prior study, there appears to be marked
fluid-filled distention of the endometrial cavity. Small cystic
lesion noted right adnexal space.

Other: Interval decrease in volume of intraperitoneal fluid with
intraperitoneal drain identified lower anterior pelvis. Numerous
areas of irregular peritoneal thickening are associated with
progression of irregular and nodular soft tissue density in the
omentum. Mesenteric congestion noted in the central abdomen.

Musculoskeletal: Mixed lytic and sclerotic lesion in the left
ischial tuberosity is similar to prior. Sclerotic lesion noted left
femoral head, as before. Heterogeneity of sacral mineralization is
similar.
IMPRESSION: 1. Interval increase in right pleural effusion now moderate to
large. Bibasilar collapse/consolidative opacity is progressive in
there is persistent tiny left pleural effusion.
2. Interval decrease in volume of intraperitoneal fluid with
intraperitoneal drain identified lower anterior pelvis.
3. Numerous areas of irregular peritoneal thickening associated with
progression of irregular and nodular soft tissue density in the
omentum. Imaging features are consistent with reported history of
peritoneal carcinomatosis.
4. Persistent marked fluid-filled distention of the endometrial
cavity.
5. Similar appearance of irregular bladder wall thickening with
areas of nodularity.
6. Similar appearance of mild lymphadenopathy in the pelvis.
7. Similar appearance of mixed lytic and sclerotic lesion in the
left ischial tuberosity and left femoral head.
8. Left double-J internal ureteral stent with stable mild right
hydroureteronephrosis.
9. Aortic Atherosclerosis ([id number]).
